# Patient Record
Sex: Male | Born: 1935 | Race: White | Hispanic: No | Marital: Married | Smoking: Former smoker
Health system: Southern US, Community
[De-identification: ages and names within clinical notes are randomized; demographics above are authoritative.]

## PROBLEM LIST (undated history)

## (undated) DIAGNOSIS — Z950 Presence of cardiac pacemaker: Secondary | ICD-10-CM

## (undated) DIAGNOSIS — I442 Atrioventricular block, complete: Secondary | ICD-10-CM

## (undated) DIAGNOSIS — IMO0002 Reserved for concepts with insufficient information to code with codable children: Secondary | ICD-10-CM

## (undated) DIAGNOSIS — I10 Essential (primary) hypertension: Secondary | ICD-10-CM

## (undated) DIAGNOSIS — F329 Major depressive disorder, single episode, unspecified: Secondary | ICD-10-CM

## (undated) DIAGNOSIS — Z9289 Personal history of other medical treatment: Secondary | ICD-10-CM

## (undated) DIAGNOSIS — Z8719 Personal history of other diseases of the digestive system: Secondary | ICD-10-CM

## (undated) DIAGNOSIS — I495 Sick sinus syndrome: Secondary | ICD-10-CM

## (undated) DIAGNOSIS — F32A Depression, unspecified: Secondary | ICD-10-CM

## (undated) DIAGNOSIS — F0390 Unspecified dementia without behavioral disturbance: Secondary | ICD-10-CM

## (undated) DIAGNOSIS — E785 Hyperlipidemia, unspecified: Secondary | ICD-10-CM

## (undated) DIAGNOSIS — I519 Heart disease, unspecified: Secondary | ICD-10-CM

## (undated) HISTORY — DX: Heart disease, unspecified: I51.9

## (undated) HISTORY — DX: Personal history of other diseases of the digestive system: Z87.19

## (undated) HISTORY — DX: Sick sinus syndrome: I49.5

## (undated) HISTORY — DX: Major depressive disorder, single episode, unspecified: F32.9

## (undated) HISTORY — DX: Depression, unspecified: F32.A

## (undated) HISTORY — DX: Personal history of other medical treatment: Z92.89

## (undated) HISTORY — DX: Atrioventricular block, complete: I44.2

## (undated) HISTORY — DX: Hyperlipidemia, unspecified: E78.5

## (undated) HISTORY — PX: ROTATOR CUFF REPAIR: SHX139

## (undated) HISTORY — PX: OTHER SURGICAL HISTORY: SHX169

---

## 1996-11-16 HISTORY — PX: PACEMAKER INSERTION: SHX728

## 1998-11-01 ENCOUNTER — Ambulatory Visit (HOSPITAL_COMMUNITY): Admission: RE | Admit: 1998-11-01 | Discharge: 1998-11-01 | Payer: Self-pay | Admitting: Cardiology

## 1998-11-01 ENCOUNTER — Encounter: Payer: Self-pay | Admitting: Cardiology

## 2003-11-17 HISTORY — PX: PACEMAKER GENERATOR CHANGE: SHX5998

## 2003-11-30 ENCOUNTER — Ambulatory Visit (HOSPITAL_COMMUNITY): Admission: RE | Admit: 2003-11-30 | Discharge: 2003-11-30 | Payer: Self-pay | Admitting: *Deleted

## 2010-04-30 DIAGNOSIS — Z9289 Personal history of other medical treatment: Secondary | ICD-10-CM

## 2010-04-30 HISTORY — PX: US ECHOCARDIOGRAPHY: HXRAD669

## 2010-04-30 HISTORY — DX: Personal history of other medical treatment: Z92.89

## 2011-07-07 ENCOUNTER — Emergency Department (HOSPITAL_COMMUNITY)
Admission: EM | Admit: 2011-07-07 | Discharge: 2011-07-08 | Disposition: A | Discharge status: Home / Self Care | Payer: Medicare Other | Attending: Emergency Medicine | Admitting: Emergency Medicine

## 2011-07-07 ENCOUNTER — Inpatient Hospital Stay (INDEPENDENT_AMBULATORY_CARE_PROVIDER_SITE_OTHER)
Admission: RE | Admit: 2011-07-07 | Discharge: 2011-07-07 | Disposition: A | Discharge status: Home / Self Care | Payer: Medicare Other | Source: Ambulatory Visit | Attending: Emergency Medicine | Admitting: Emergency Medicine

## 2011-07-07 DIAGNOSIS — N289 Disorder of kidney and ureter, unspecified: Secondary | ICD-10-CM | POA: Insufficient documentation

## 2011-07-07 DIAGNOSIS — Z79899 Other long term (current) drug therapy: Secondary | ICD-10-CM | POA: Insufficient documentation

## 2011-07-07 DIAGNOSIS — R5381 Other malaise: Secondary | ICD-10-CM | POA: Insufficient documentation

## 2011-07-07 DIAGNOSIS — R7309 Other abnormal glucose: Secondary | ICD-10-CM | POA: Insufficient documentation

## 2011-07-07 DIAGNOSIS — R42 Dizziness and giddiness: Secondary | ICD-10-CM | POA: Insufficient documentation

## 2011-07-07 DIAGNOSIS — Z7982 Long term (current) use of aspirin: Secondary | ICD-10-CM | POA: Insufficient documentation

## 2011-07-07 DIAGNOSIS — R61 Generalized hyperhidrosis: Secondary | ICD-10-CM | POA: Insufficient documentation

## 2011-07-07 DIAGNOSIS — R6883 Chills (without fever): Secondary | ICD-10-CM | POA: Insufficient documentation

## 2011-07-07 DIAGNOSIS — Z950 Presence of cardiac pacemaker: Secondary | ICD-10-CM | POA: Insufficient documentation

## 2011-07-07 DIAGNOSIS — R079 Chest pain, unspecified: Secondary | ICD-10-CM

## 2011-07-07 DIAGNOSIS — I2510 Atherosclerotic heart disease of native coronary artery without angina pectoris: Secondary | ICD-10-CM | POA: Insufficient documentation

## 2011-07-07 LAB — URINALYSIS, ROUTINE W REFLEX MICROSCOPIC
Bilirubin Urine: NEGATIVE
Glucose, UA: NEGATIVE mg/dL
Hgb urine dipstick: NEGATIVE
Ketones, ur: 15 mg/dL — AB
Leukocytes, UA: NEGATIVE
Nitrite: NEGATIVE
Protein, ur: NEGATIVE mg/dL
Specific Gravity, Urine: 1.02 (ref 1.005–1.030)
Urobilinogen, UA: 0.2 mg/dL (ref 0.0–1.0)
pH: 5 (ref 5.0–8.0)

## 2011-07-07 LAB — POCT I-STAT, CHEM 8
BUN: 50 mg/dL — ABNORMAL HIGH (ref 6–23)
Calcium, Ion: 1.38 mmol/L — ABNORMAL HIGH (ref 1.12–1.32)
Chloride: 107 mEq/L (ref 96–112)
Creatinine, Ser: 1.1 mg/dL (ref 0.50–1.35)
Glucose, Bld: 142 mg/dL — ABNORMAL HIGH (ref 70–99)
HCT: 36 % — ABNORMAL LOW (ref 39.0–52.0)
Hemoglobin: 12.2 g/dL — ABNORMAL LOW (ref 13.0–17.0)
Potassium: 5.1 mEq/L (ref 3.5–5.1)
Sodium: 140 mEq/L (ref 135–145)
TCO2: 27 mmol/L (ref 0–100)

## 2011-07-08 ENCOUNTER — Emergency Department (HOSPITAL_COMMUNITY): Payer: Medicare Other

## 2011-07-08 ENCOUNTER — Inpatient Hospital Stay (HOSPITAL_COMMUNITY)
Admission: EM | Admit: 2011-07-08 | Discharge: 2011-07-13 | DRG: 378 | Disposition: A | Discharge status: Home / Self Care | Payer: Medicare Other | Attending: Internal Medicine | Admitting: Internal Medicine

## 2011-07-08 DIAGNOSIS — K5730 Diverticulosis of large intestine without perforation or abscess without bleeding: Secondary | ICD-10-CM | POA: Diagnosis present

## 2011-07-08 DIAGNOSIS — D62 Acute posthemorrhagic anemia: Secondary | ICD-10-CM | POA: Diagnosis present

## 2011-07-08 DIAGNOSIS — I951 Orthostatic hypotension: Secondary | ICD-10-CM | POA: Diagnosis present

## 2011-07-08 DIAGNOSIS — K2981 Duodenitis with bleeding: Secondary | ICD-10-CM | POA: Diagnosis present

## 2011-07-08 DIAGNOSIS — K264 Chronic or unspecified duodenal ulcer with hemorrhage: Principal | ICD-10-CM | POA: Diagnosis present

## 2011-07-08 DIAGNOSIS — Z833 Family history of diabetes mellitus: Secondary | ICD-10-CM

## 2011-07-08 DIAGNOSIS — F0390 Unspecified dementia without behavioral disturbance: Secondary | ICD-10-CM | POA: Diagnosis present

## 2011-07-08 DIAGNOSIS — I10 Essential (primary) hypertension: Secondary | ICD-10-CM | POA: Diagnosis present

## 2011-07-08 DIAGNOSIS — E876 Hypokalemia: Secondary | ICD-10-CM | POA: Diagnosis present

## 2011-07-08 DIAGNOSIS — Z950 Presence of cardiac pacemaker: Secondary | ICD-10-CM

## 2011-07-08 DIAGNOSIS — E785 Hyperlipidemia, unspecified: Secondary | ICD-10-CM | POA: Diagnosis present

## 2011-07-08 LAB — DIFFERENTIAL
Lymphocytes Relative: 21 % (ref 12–46)
Lymphs Abs: 3 10*3/uL (ref 0.7–4.0)
Monocytes Relative: 10 % (ref 3–12)
Neutro Abs: 9.8 10*3/uL — ABNORMAL HIGH (ref 1.7–7.7)
Neutrophils Relative %: 68 % (ref 43–77)

## 2011-07-08 LAB — COMPREHENSIVE METABOLIC PANEL
AST: 11 U/L (ref 0–37)
Alkaline Phosphatase: 54 U/L (ref 39–117)
BUN: 57 mg/dL — ABNORMAL HIGH (ref 6–23)
CO2: 26 mEq/L (ref 19–32)
Chloride: 109 mEq/L (ref 96–112)
Creatinine, Ser: 0.84 mg/dL (ref 0.50–1.35)
GFR calc non Af Amer: >60 mL/min (ref >60)
Potassium: 4.5 mEq/L (ref 3.5–5.1)
Total Bilirubin: 0.1 mg/dL — ABNORMAL LOW (ref 0.3–1.2)

## 2011-07-08 LAB — CBC
HCT: 24.9 % — ABNORMAL LOW (ref 39.0–52.0)
Hemoglobin: 8.7 g/dL — ABNORMAL LOW (ref 13.0–17.0)
MCH: 33 pg (ref 26.0–34.0)
MCV: 94.3 fL (ref 78.0–100.0)
RBC: 2.64 MIL/uL — ABNORMAL LOW (ref 4.22–5.81)

## 2011-07-08 LAB — LIPASE, BLOOD: Lipase: 24 U/L (ref 11–59)

## 2011-07-09 DIAGNOSIS — K2980 Duodenitis without bleeding: Secondary | ICD-10-CM

## 2011-07-09 DIAGNOSIS — D62 Acute posthemorrhagic anemia: Secondary | ICD-10-CM

## 2011-07-09 DIAGNOSIS — K921 Melena: Secondary | ICD-10-CM

## 2011-07-09 DIAGNOSIS — K260 Acute duodenal ulcer with hemorrhage: Secondary | ICD-10-CM

## 2011-07-09 LAB — COMPREHENSIVE METABOLIC PANEL
AST: 11 U/L (ref 0–37)
BUN: 53 mg/dL — ABNORMAL HIGH (ref 6–23)
CO2: 21 mEq/L (ref 19–32)
Calcium: 8.8 mg/dL (ref 8.4–10.5)
Chloride: 111 mEq/L (ref 96–112)
Creatinine, Ser: 0.73 mg/dL (ref 0.50–1.35)
GFR calc Af Amer: >60 mL/min (ref >60)
GFR calc non Af Amer: >60 mL/min (ref >60)
Glucose, Bld: 103 mg/dL — ABNORMAL HIGH (ref 70–99)
Total Bilirubin: 0.5 mg/dL (ref 0.3–1.2)

## 2011-07-09 LAB — CBC
HCT: 25.2 % — ABNORMAL LOW (ref 39.0–52.0)
Hemoglobin: 8.5 g/dL — ABNORMAL LOW (ref 13.0–17.0)
Hemoglobin: 9 g/dL — ABNORMAL LOW (ref 13.0–17.0)
MCH: 32.2 pg (ref 26.0–34.0)
MCHC: 35.7 g/dL (ref 30.0–36.0)
MCV: 91.3 fL (ref 78.0–100.0)
Platelets: 194 10*3/uL (ref 150–400)
Platelets: 182 10*3/uL (ref 150–400)
RBC: 2.88 MIL/uL — ABNORMAL LOW (ref 4.22–5.81)
RBC: 2.64 MIL/uL — ABNORMAL LOW (ref 4.22–5.81)
RBC: 2.78 MIL/uL — ABNORMAL LOW (ref 4.22–5.81)
RDW: 13.2 % (ref 11.5–15.5)
WBC: 12.4 10*3/uL — ABNORMAL HIGH (ref 4.0–10.5)
WBC: 14.5 10*3/uL — ABNORMAL HIGH (ref 4.0–10.5)

## 2011-07-09 LAB — GLUCOSE, CAPILLARY
Glucose-Capillary: 132 mg/dL — ABNORMAL HIGH (ref 70–99)
Glucose-Capillary: 124 mg/dL — ABNORMAL HIGH (ref 70–99)
Glucose-Capillary: 103 mg/dL — ABNORMAL HIGH (ref 70–99)
Glucose-Capillary: 135 mg/dL — ABNORMAL HIGH (ref 70–99)
Glucose-Capillary: 133 mg/dL — ABNORMAL HIGH (ref 70–99)

## 2011-07-09 LAB — CK TOTAL AND CKMB (NOT AT ARMC): CK, MB: 2.6 ng/mL (ref 0.3–4.0)

## 2011-07-09 LAB — ABO/RH: ABO/RH(D): O POS

## 2011-07-09 LAB — PROTIME-INR: INR: 1.18 (ref 0.00–1.49)

## 2011-07-09 LAB — MRSA PCR SCREENING: MRSA by PCR: NEGATIVE

## 2011-07-09 LAB — CARDIAC PANEL(CRET KIN+CKTOT+MB+TROPI)
CK, MB: 2.4 ng/mL (ref 0.3–4.0)
Relative Index: INVALID (ref 0.0–2.5)
Relative Index: INVALID (ref 0.0–2.5)
Total CK: 76 U/L (ref 7–232)
Total CK: 75 U/L (ref 7–232)

## 2011-07-09 LAB — MAGNESIUM: Magnesium: 1.7 mg/dL (ref 1.5–2.5)

## 2011-07-09 LAB — CLOSTRIDIUM DIFFICILE BY PCR: Toxigenic C. Difficile by PCR: NEGATIVE

## 2011-07-09 MED ORDER — IOHEXOL 300 MG/ML  SOLN
100.0000 mL | Freq: Once | INTRAMUSCULAR | Status: AC | PRN
  Administered 2011-07-09: 100 mL via INTRAVENOUS

## 2011-07-10 LAB — BASIC METABOLIC PANEL
BUN: 28 mg/dL — ABNORMAL HIGH (ref 6–23)
Chloride: 115 mEq/L — ABNORMAL HIGH (ref 96–112)
Creatinine, Ser: 0.79 mg/dL (ref 0.50–1.35)
GFR calc non Af Amer: >60 mL/min (ref >60)
Glucose, Bld: 102 mg/dL — ABNORMAL HIGH (ref 70–99)
Potassium: 3.4 mEq/L — ABNORMAL LOW (ref 3.5–5.1)

## 2011-07-10 LAB — CBC
HCT: 23 % — ABNORMAL LOW (ref 39.0–52.0)
HCT: 23.1 % — ABNORMAL LOW (ref 39.0–52.0)
HCT: 25.1 % — ABNORMAL LOW (ref 39.0–52.0)
Hemoglobin: 8.3 g/dL — ABNORMAL LOW (ref 13.0–17.0)
Hemoglobin: 8.1 g/dL — ABNORMAL LOW (ref 13.0–17.0)
MCH: 32.8 pg (ref 26.0–34.0)
MCH: 32.2 pg (ref 26.0–34.0)
MCHC: 36.1 g/dL — ABNORMAL HIGH (ref 30.0–36.0)
MCHC: 35.1 g/dL (ref 30.0–36.0)
MCV: 90.9 fL (ref 78.0–100.0)
MCV: 92.4 fL (ref 78.0–100.0)
MCV: 93 fL (ref 78.0–100.0)
RBC: 2.7 MIL/uL — ABNORMAL LOW (ref 4.22–5.81)
RDW: 13.4 % (ref 11.5–15.5)
WBC: 9.5 10*3/uL (ref 4.0–10.5)

## 2011-07-10 LAB — CROSSMATCH
ABO/RH(D): O POS
Unit division: 0

## 2011-07-10 LAB — GLUCOSE, CAPILLARY

## 2011-07-10 LAB — DIFFERENTIAL
Basophils Absolute: 0.1 10*3/uL (ref 0.0–0.1)
Eosinophils Relative: 3 % (ref 0–5)
Lymphocytes Relative: 28 % (ref 12–46)
Lymphs Abs: 2.5 10*3/uL (ref 0.7–4.0)
Monocytes Absolute: 0.8 10*3/uL (ref 0.1–1.0)
Neutro Abs: 5.4 10*3/uL (ref 1.7–7.7)

## 2011-07-11 DIAGNOSIS — K921 Melena: Secondary | ICD-10-CM

## 2011-07-11 DIAGNOSIS — K2980 Duodenitis without bleeding: Secondary | ICD-10-CM

## 2011-07-11 DIAGNOSIS — K260 Acute duodenal ulcer with hemorrhage: Secondary | ICD-10-CM

## 2011-07-11 DIAGNOSIS — D62 Acute posthemorrhagic anemia: Secondary | ICD-10-CM

## 2011-07-11 LAB — CBC
HCT: 22.5 % — ABNORMAL LOW (ref 39.0–52.0)
Hemoglobin: 7.8 g/dL — ABNORMAL LOW (ref 13.0–17.0)
MCH: 32 pg (ref 26.0–34.0)
MCH: 32.4 pg (ref 26.0–34.0)
MCHC: 34.7 g/dL (ref 30.0–36.0)
MCHC: 34.6 g/dL (ref 30.0–36.0)
MCV: 93.4 fL (ref 78.0–100.0)
Platelets: 214 10*3/uL (ref 150–400)
RBC: 2.44 MIL/uL — ABNORMAL LOW (ref 4.22–5.81)
RDW: 13.3 % (ref 11.5–15.5)
RDW: 13.3 % (ref 11.5–15.5)

## 2011-07-11 LAB — BASIC METABOLIC PANEL
BUN: 15 mg/dL (ref 6–23)
Calcium: 8.5 mg/dL (ref 8.4–10.5)
Creatinine, Ser: 0.84 mg/dL (ref 0.50–1.35)
GFR calc non Af Amer: >60 mL/min (ref >60)
Glucose, Bld: 101 mg/dL — ABNORMAL HIGH (ref 70–99)

## 2011-07-12 DIAGNOSIS — K260 Acute duodenal ulcer with hemorrhage: Secondary | ICD-10-CM

## 2011-07-12 DIAGNOSIS — K921 Melena: Secondary | ICD-10-CM

## 2011-07-12 DIAGNOSIS — D62 Acute posthemorrhagic anemia: Secondary | ICD-10-CM

## 2011-07-12 DIAGNOSIS — K2980 Duodenitis without bleeding: Secondary | ICD-10-CM

## 2011-07-12 LAB — CBC
HCT: 21.5 % — ABNORMAL LOW (ref 39.0–52.0)
MCHC: 34.9 g/dL (ref 30.0–36.0)
Platelets: 212 10*3/uL (ref 150–400)
RDW: 13.4 % (ref 11.5–15.5)

## 2011-07-12 LAB — BASIC METABOLIC PANEL
BUN: 12 mg/dL (ref 6–23)
GFR calc Af Amer: >60 mL/min (ref >60)
GFR calc non Af Amer: >60 mL/min (ref >60)
Potassium: 3.6 mEq/L (ref 3.5–5.1)

## 2011-07-13 ENCOUNTER — Telehealth: Payer: Self-pay

## 2011-07-13 LAB — CROSSMATCH

## 2011-07-13 LAB — CBC
HCT: 26.3 % — ABNORMAL LOW (ref 39.0–52.0)
Hemoglobin: 9 g/dL — ABNORMAL LOW (ref 13.0–17.0)
MCH: 31.7 pg (ref 26.0–34.0)
MCHC: 34.2 g/dL (ref 30.0–36.0)

## 2011-07-13 MED ORDER — METRONIDAZOLE 500 MG PO TABS: 500.0000 mg | ORAL_TABLET | Freq: Two times a day (BID) | ORAL | Status: AC

## 2011-07-13 MED ORDER — CLARITHROMYCIN 500 MG PO TABS: 500.0000 mg | ORAL_TABLET | Freq: Two times a day (BID) | ORAL | Status: AC

## 2011-07-13 NOTE — Telephone Encounter (Signed)
Take what I prescribed previously without change. No need to take Cipro

## 2011-07-13 NOTE — Telephone Encounter (Signed)
Spoke with wife and she is aware, prescriptions sent to pharmacy for pt.

## 2011-07-13 NOTE — Telephone Encounter (Signed)
Called and spoke with the pt. Dr. Kevan Ny had prescribed Cipro 500mg  BID for 7 days and Metronidazole 500mg  BID for 7 days right before he was hospitalized. He only took one dose of each prior to going to the ER. Should pt just have 7 more days of the Metronidazole called in along with the Biaxin and hold the Cipro? Pt has the PPI and knows how to take that. Please advise.

## 2011-07-13 NOTE — Telephone Encounter (Signed)
Message copied by Michele Mcalpine on Mon Jul 13, 2011  2:31 PM ------      Message from: Hilarie Fredrickson      Created: Mon Jul 13, 2011  2:10 PM       Bonita Quin, I saw this patient at the hospital for a bleeding duodenal ulcer. His here go back to pylori antibody returned today and is positive. I think he was to be discharged home today. Call the patient and his wife and let them know that I recommend treatment for Helicobacter pylori. We should treat him with Biaxin 500 mg twice a day x2 weeks and metronidazole 500 mg twice a day x2 weeks. He is already on a twice a day PPI which he should continue. After completing his antibiotics, he should continue on twice a day PPI for another 6 weeks then once daily PPI indefinitely. I did review this with them previously

## 2011-07-21 NOTE — H&P (Signed)
NAME:  ULMER, DEGEN NO.:  [id number]  MEDICAL RECORD NO.:  [id number]  LOCATION:                                 FACILITY:  PHYSICIAN:  Eduard Clos, MDDATE OF BIRTH:  [birth year]  DATE OF ADMISSION: DATE OF DISCHARGE:                             HISTORY & PHYSICAL   PRIMARY CARE PHYSICIAN:  Hal T. Stoneking, MD  PRIMARY GASTROENTEROLOGY:  Judie Petit T. Russella Dar, MD, [facility]  CHIEF COMPLAINT:  Bleeding per rectum.  HISTORY OF PRESENT ILLNESS:  This 77 year old male with known history of hypertension, dementia, hyperlipidemia, pacemaker placement, has been experiencing some dizziness over the last 48 hours.  The patient had originally come night before this to the ER when the patient was found to be dizzy, was given some IV fluids, and sent home.  Yesterday morning, he started developing some rectal bleeding.  Initially, he had formed stools, he had gone to Dr. Laverle Hobby office but was seen by Dr. Kevan Ny as Dr. Pete Glatter was not there.  When Dr. Kevan Ny examined the patient, the patient had some left lower quadrant tenderness, was prescribed Cipro and Flagyl.  Despite taking these, the patient had further bowel movements with frank bleeding at least 3-4.  He came to the ER.  In the ER, the patient was found to have hemoglobin of 8.7, that is almost 4 grams drop from 24 hours and the patient also was orthostatic with blood pressure dropping to 80 systolic when he stands up.  At this time, the patient is admitted for acute GI bleed, most likely source could be lower GI.  Reviewing Dr. Kevan Ny' notes, it shows that the patient does have a diverticulosis and the patient's daughter states that the patient has had a colonoscopy within the last 5 years with Dr. Russella Dar and that showed some polyps.  The patient was dizzy.  Presently, on lying down, he is asymptomatic. Denies any chest pain, shortness of breath.  Did have some diaphoresis when he moved his bowels.   Denies any nausea, vomiting, and denies any abdominal pain at this time.  Denies any dysuria, discharges.  Has not had any bowel movements after coming to the ER.  Denies any cough or phlegm or fever or chills, headache or visual symptoms or focal deficit.  PAST MEDICAL HISTORY: 1. History of hypertension. 2. Hyperlipidemia. 3. Dementia. 4. Pacemaker placement.  MEDICATIONS PRIOR TO ADMISSION:  The patient is on aspirin 81 mg p.o. daily, was just started on Cipro and Flagyl yesterday by Dr. Kevan Ny.  The patient is on benazepril, gemfibrozil, folic acid.  SOCIAL HISTORY:  The patient quit smoking in 1995.  Denies any alcohol or drug abuse.  Lives with his wife.  FAMILY HISTORY:  Positive for diabetes in his parents and dementia in his sister.  ALLERGIES:  No known drug allergies.  REVIEW OF SYSTEMS:  As per the history of presenting illness, nothing else significant.  PHYSICAL EXAMINATION:  GENERAL:  The patient was examined at bedside, not in acute distress. VITAL SIGNS:  Blood pressure 120/60, pulse 80 per minute, temperature 98.7, respirations 18 per minute, O2 sat 100%. HEENT:  Anicteric.  Mild pallor.  The patient's face looks  little bit pale.  No facial asymmetry.  Tongue is midline. NECK:  No neck rigidity.  No discharge from ears, eyes, nose, or mouth. CHEST:  Bilateral air entry present.  No rhonchi, no crepitation. HEART:  S1 and S2 heard. ABDOMEN:  Soft, nontender.  Bowel sounds heard. CENTRAL NERVOUS SYSTEM:  The patient is alert, awake, and oriented to time, place, and person.  He is able to move upper and lower extremities. EXTREMITIES:  Peripheral pulses felt.  No acute ischemic changes, cyanosis, or clubbing.  Monitor showing paced rhythm at this time.  CBC:  WBC is 14.4; hemoglobin is 8.7 and 24 hours before that was 12.2; hematocrit is 24.9, a drop from 36 within 24 hours; platelets 229.  PT and INR are 15.3 and 1.1.  Complete metabolic panel:  Sodium  140, potassium 4.5, chloride 109, carbon dioxide 26, glucose 132, BUN 57, creatinine 0.8, total bilirubin is 0.1, alkaline phos 54, AST 11, ALT 6, total protein is 5.5, albumin 3.3, calcium 10.1, lipase is 24.  Fecal occult blood is negative.  ASSESSMENT: 1. Acute gastrointestinal bleed. 2. Anemia from acute blood loss. 3. History of hypertension, presently mildly hypotensive. 4. History of pacemaker placement. 5. History of dementia. 6. History of hyperlipidemia.  PLAN: 1. At this time, I will admit the patient to intensive care as the     patient is a bit orthostatic when he stands up and also had a 4-g     drop in his hemoglobin within 24 hours. 2. For his acute GI bleed and acute blood loss anemia, the patient is     receiving two units of packed red blood cells at this time.  The     patient will be kept n.p.o.  We will type and cross match for 4     units and hold, transfuse as necessary.  We will check CBC q.6 h.     for the next 24 hours.  I have consulted Dr. Leone Payor who will be     seeing the patient later.  As the patient does have mild     leukocytosis and also Dr. Kevan Ny' notes state that the patient has     some left lower quadrant pain, presently he has no pain, I am     keeping the patient on empirically Cipro and Flagyl.  We will also     get stool studies.  The patient is already going to get a CAT scan     of the abdomen and pelvis.  We will follow these results.  Based on the patient's clinical condition, test order, and consults recommendation, further orders will be recommended.     Eduard Clos, MD     ANK/MEDQ  D:  07/09/2011  T:  07/09/2011  Job:  [id number]  cc:   Venita Lick. Russella Dar, MD, FACG Hal T. Stoneking, M.D.  Electronically Signed by Midge Minium MD on 07/21/2011 09:33:58 AM

## 2011-08-13 NOTE — Discharge Summary (Signed)
NAME:  Kenneth Clay, Kenneth Clay NO.:  [id number]  MEDICAL RECORD NO.:  [id number]  LOCATION:  3305                         FACILITY:  [facility]  PHYSICIAN:  Lonia Blood, M.D.DATE OF BIRTH:  1932-10-27  DATE OF ADMISSION:  07/08/2011 DATE OF DISCHARGE:  07/13/2011                        DISCHARGE SUMMARY - REFERRING   PRIMARY CARE PHYSICIAN:  Hal T. Pete Glatter, MD  PRIMARY GASTROENTEROLOGIST:  Venita Lick. Russella Dar, MD, [facility].  EXPECTED DATE OF DISPOSITION:  July 13, 2011.  DISCHARGE DIAGNOSES: 1. Gastrointestinal bleed secondary to duodenal ulcer and duodenitis. 2. Acute blood loss anemia secondary to above. 3. History of hypertension, blood pressure medications held during     hospitalization. 4. History of hyperlipidemia. 5. Mild dementia. 6. Mild hypokalemia, resolved.  CONSULTATIONS DURING HOSPITALIZATION:   Gastroenterology.  PROCEDURES DURING HOSPITALIZATION: 1. CT of the abdomen and pelvis performed July 09, 2011, showing     diffuse diverticulosis without any evidence of diverticulitis, also     with mild BPH. 2. Upper endoscopy performed on July 09, 2011, by Dr. Yancey Flemings,     showing bleeding ulcer in the descending duodenum as well as     duodenitis.  BRIEF HISTORY OF PRESENT ILLNESS:  Kenneth Clay is a 77 year old male with past medical history of hypertension, mild dementia and hyperlipidemia, who presented to the emergency department on day of admission with complaints of bleeding per rectum.  Apparently, the patient had been to the ER on day prior with complaints of dizziness, however, at that time the patient's symptoms resolved with IV fluids and he was found to be hemodynamically stable during that visit and discharged home.  The patient then followed up with primary care physician's office as he developed rectal bleeding on morning after ED visit.  The patient was prescribed empiric Cipro and Flagyl as he did have some left  lower quadrant tenderness and thought maybe to be experiencing acute diverticular flare. Despite taking medications the patient continued to have frank rectal bleeding approximately 3-4 times prompting him to return to the ER.  Upon evaluation in the ER, the patient's hemoglobin found to have dropped to 8.7, almost 4 grams down from 24 hours prior. In addition, the patient was found to be orthostatic with systolic blood pressure dropping into the 80s upon standing.  At that time, the patient was admitted by [facility] for further evaluation and treatment.  COURSE OF HOSPITALIZATION: 1. GI bleed secondary to duodenal ulcer and duodenitis.  Given the     patient's orthostasis and active bleeding, the patient was     monitored throughout hospitalization in the step-down unit.  The     patient was initially transfused 2 units of packed red blood cells,     however, continued to have profound anemia and hypotension thereby     prompting two more units of packed red blood cells to be     transfused.  The patient did undergo upper endoscopy on July 09, 2011, showing bleeding ulcer in the bulb and descending portion of     the duodenum as well as duodenitis.  The patient was initially     placed on PPI drip and  has since been transitioned to p.o. PPI to     be taken b.i.d. for 8 weeks and then daily indefinitely.  The     patient's H. pylori obtained during endoscopy were both negative.     After 4 units of packed red blood cells, the patient is now     hemodynamically stable with no recurrent orthostasis and hemoglobin     of 9.0.  The patient has been instructed to avoid all NSAIDs,     though he denies any heavy use prior to this admission. 2. Acute blood loss anemia secondary to GI bleed.  As mentioned above,     the patient did receive 4 units of packed red blood cells during     this hospitalization.  At this point, he remains hemodynamically     stable with no recurrent  orthostasis.  The patient is felt     medically stable for discharge home with outpatient followup with     primary care physician next week to recheck hemoglobin. 3. History of hypertension.  Again, the patient was orthostatic at     time of admission, his blood pressure medications were held     throughout hospitalization.  The patient's blood pressure was     stable at this point, however, we will continue to hold the     patient's benazepril until follow with primary care physician next     week to determine further treatment. 4. Hypokalemia, mild.  Resolved with p.o. repletion.  DISCHARGE MEDICATIONS: 1. Protonix 40 mg p.o. b.i.d. until August 30, 2011, then 1 tablet     p.o. daily indefinitely. 2. Aricept 10 mg p.o. daily. 3. Calcium over the counter 1 tablet p.o. daily. 4. Folic acid 2 tablets p.o. daily. 5. Gemfibrozil 600 mg tab half tab p.o. b.i.d. 6. Multivitamin p.o. daily. 7. The patient instructed to discontinue Cipro and Flagyl. 8. The patient instructed to hold benazepril and aspirin until follow     with primary care physician next week.  PERTINENT LAB FINDINGS:  Discharge hemoglobin 9.0 up from as low as 7.5 during hospitalization.  White cell count 9.9, platelet count 243, sodium 146, potassium 3.6, BUN 12, creatinine 0.79, H. pylori test is negative.  C. diff PCR negative.  DISPOSITION:  The patient is felt medically stable for discharge at this time.  The patient has been ambulated by nursing staff and denies any dizziness, chest pain or shortness of breath.  The patient has not had any recurrent rectal bleeding in greater than 24 hours.  The patient is nonorthostatic and hemoglobin is stable at this point.  The patient is scheduled for followup with his primary care physician Dr. Orson Gear on Tuesday July 21, 2011, at 2:00 p.m. at which time he will need a recheck CBC and possible restart of blood pressure medications.     Cordelia Pen,  NP   ______________________________ Lonia Blood, M.D.    LE/MEDQ  D:  07/13/2011  T:  07/13/2011  Job:  [id number]  cc:   Hal T. Stoneking, M.D. Venita Lick. Russella Dar, MD, [facility]  Electronically Signed by Cordelia Pen NP on 07/17/2011 03:30:01 PM Electronically Signed by Jetty Duhamel M.D. on 08/13/2011 09:47:53 AM

## 2011-09-24 ENCOUNTER — Emergency Department (HOSPITAL_COMMUNITY)
Admission: EM | Admit: 2011-09-24 | Discharge: 2011-09-24 | Disposition: A | Discharge status: Home / Self Care | Payer: Medicare Other | Attending: Emergency Medicine | Admitting: Emergency Medicine

## 2011-09-24 ENCOUNTER — Encounter: Payer: Self-pay | Admitting: Emergency Medicine

## 2011-09-24 ENCOUNTER — Emergency Department (HOSPITAL_COMMUNITY): Payer: Medicare Other

## 2011-09-24 ENCOUNTER — Other Ambulatory Visit: Payer: Self-pay

## 2011-09-24 DIAGNOSIS — W1789XA Other fall from one level to another, initial encounter: Secondary | ICD-10-CM | POA: Insufficient documentation

## 2011-09-24 DIAGNOSIS — R0789 Other chest pain: Secondary | ICD-10-CM | POA: Insufficient documentation

## 2011-09-24 DIAGNOSIS — W19XXXA Unspecified fall, initial encounter: Secondary | ICD-10-CM

## 2011-09-24 DIAGNOSIS — Z950 Presence of cardiac pacemaker: Secondary | ICD-10-CM | POA: Insufficient documentation

## 2011-09-24 DIAGNOSIS — F0390 Unspecified dementia without behavioral disturbance: Secondary | ICD-10-CM | POA: Insufficient documentation

## 2011-09-24 DIAGNOSIS — I10 Essential (primary) hypertension: Secondary | ICD-10-CM | POA: Insufficient documentation

## 2011-09-24 DIAGNOSIS — S20219A Contusion of unspecified front wall of thorax, initial encounter: Secondary | ICD-10-CM | POA: Insufficient documentation

## 2011-09-24 DIAGNOSIS — IMO0002 Reserved for concepts with insufficient information to code with codable children: Secondary | ICD-10-CM | POA: Insufficient documentation

## 2011-09-24 HISTORY — DX: Reserved for concepts with insufficient information to code with codable children: IMO0002

## 2011-09-24 HISTORY — DX: Unspecified dementia, unspecified severity, without behavioral disturbance, psychotic disturbance, mood disturbance, and anxiety: F03.90

## 2011-09-24 HISTORY — DX: Presence of cardiac pacemaker: Z95.0

## 2011-09-24 HISTORY — DX: Essential (primary) hypertension: I10

## 2011-09-24 MED ORDER — MORPHINE SULFATE 4 MG/ML IJ SOLN: 4.0000 mg | Freq: Once | INTRAMUSCULAR | Status: DC

## 2011-09-24 MED ORDER — HYDROCODONE-ACETAMINOPHEN 5-325 MG PO TABS
1.0000 | ORAL_TABLET | Freq: Once | ORAL | Status: AC
  Administered 2011-09-24: 1 via ORAL
  Filled 2011-09-24: qty 1

## 2011-09-24 MED ORDER — HYDROCODONE-ACETAMINOPHEN 5-325 MG PO TABS: 1.0000 | ORAL_TABLET | ORAL | Status: AC | PRN

## 2011-09-24 MED ORDER — ONDANSETRON HCL 4 MG/2ML IJ SOLN
4.0000 mg | Freq: Once | INTRAMUSCULAR | Status: AC
  Administered 2011-09-24: 4 mg via INTRAVENOUS
  Filled 2011-09-24: qty 2

## 2011-09-24 MED ORDER — MORPHINE SULFATE 4 MG/ML IJ SOLN
4.0000 mg | Freq: Once | INTRAMUSCULAR | Status: AC
  Administered 2011-09-24: 4 mg via INTRAVENOUS
  Filled 2011-09-24: qty 1

## 2011-09-24 NOTE — ED Provider Notes (Signed)
I saw and evaluated the patient, reviewed the resident's note and I agree with the findings and plan.  Nicholes Stairs, MD 09/24/11 [phone]

## 2011-09-24 NOTE — ED Provider Notes (Signed)
History     CSN: [id number] Arrival date & time: 09/24/2011  9:08 AM   First MD Initiated Contact with Patient 09/24/11 [phone]      Chief Complaint  Patient presents with  . Fall    right rib pain    (Consider location/radiation/quality/duration/timing/severity/associated sxs/prior treatment) Patient is a 77 y.o. male presenting with fall. The history is provided by the patient and the EMS personnel.  Fall The accident occurred 2 days ago. The fall occurred from a stool. He fell from a height of 3 to 5 ft. He landed on concrete. Point of impact: R chest. Pain location: R chest. The pain is severe. He was ambulatory at the scene. There was no entrapment after the fall. There was no drug use involved in the accident. Pertinent negatives include no visual change, no fever, no numbness, no abdominal pain, no vomiting, no hematuria and no loss of consciousness. The symptoms are aggravated by activity. He has tried nothing for the symptoms.    Past Medical History  Diagnosis Date  . Pacemaker   . Dementia   . Ulcer   . Hypertension     History reviewed. No pertinent past surgical history.  History reviewed. No pertinent family history.  History  Substance Use Topics  . Smoking status: Never Smoker   . Smokeless tobacco: Not on file  . Alcohol Use: No      Review of Systems  Constitutional: Negative for fever.  Respiratory: Positive for shortness of breath. Negative for chest tightness and stridor.   Cardiovascular: Negative for chest pain.  Gastrointestinal: Negative for vomiting and abdominal pain.  Genitourinary: Negative for hematuria and difficulty urinating.  Neurological: Negative for loss of consciousness and numbness.  All other systems reviewed and are negative.    Allergies  Review of patient's allergies indicates no known allergies.  Home Medications   Current Outpatient Rx  Name Route Sig Dispense Refill  . BENAZEPRIL HCL 20 MG PO TABS Oral Take 20 mg by  mouth daily.      . DONEPEZIL HCL 10 MG PO TABS Oral Take 10 mg by mouth daily.      Marland Kitchen GEMFIBROZIL 600 MG PO TABS Oral Take 300 mg by mouth 2 (two) times daily before a meal. Half-tablet     . PANTOPRAZOLE SODIUM 40 MG PO TBEC Oral Take 40 mg by mouth 2 (two) times daily.      . TRAMADOL HCL 50 MG PO TABS Oral Take 50-100 mg by mouth every 6 (six) hours as needed. For pain  Maximum dose= 8 tablets per day     . HYDROCODONE-ACETAMINOPHEN 5-325 MG PO TABS Oral Take 1 tablet by mouth every 4 (four) hours as needed for pain. 10 tablet 0    BP 113/49  Pulse 74  Temp(Src) 98 F (36.7 C) (Oral)  Resp 18  SpO2 96%  Physical Exam  Nursing note and vitals reviewed. Constitutional: He is oriented to person, place, and time. He appears well-developed and well-nourished.  HENT:  Head: Normocephalic and atraumatic.  Eyes: EOM are normal. Pupils are equal, round, and reactive to light.  Neck: Normal range of motion.  Cardiovascular: Normal rate, regular rhythm and normal heart sounds.   Pulmonary/Chest: Effort normal and breath sounds normal. He exhibits tenderness (TTP of R lower chest).  Abdominal: Soft. There is no tenderness. There is no rebound and no guarding.  Musculoskeletal: Normal range of motion. He exhibits no edema.  Neurological: He is alert and oriented to  person, place, and time. No cranial nerve deficit.  Skin: Skin is warm and dry.    ED Course  [place] bedside Date/Time: 09/24/2011 9:20 AM Performed by: Nena Alexander Authorized by: Nicholes Stairs Consent: Verbal consent obtained. Consent given by: patient Comments: FAST performed on patient due to RUQ pain.  Neg for free fluid in abd   (including critical care time)  Labs Reviewed - No data to display Dg Chest Portable 1 View  09/24/2011  *RADIOLOGY REPORT*  Clinical Data: Fall and right-sided chest pain.  PORTABLE CHEST - 1 VIEW  Comparison: None.  Findings: Single view of the chest demonstrates a left cardiac  pacemaker.  No evidence for a pneumothorax.  There is no focal lung disease.  Heart size is within normal limits and the trachea is midline.  Surgical changes in the right shoulder.  Question old left rib fractures.  IMPRESSION: No acute chest findings.  Original Report Authenticated By: Richarda Overlie, M.D.     1. Fall   2. Rib contusion       Date: 09/24/2011  Rate: 69  Rhythm: Atrial paced  QRS Axis: normal  Intervals: normal  ST/T Wave abnormalities: normal  Conduction Disutrbances:right bundle branch block  Narrative Interpretation:   Old EKG Reviewed: unchanged   MDM  Pt presented after fall from stool that had wheels 2 days ago.  Was seen at Urgent care prescribed tramadol and sent home.  CXR per family was neg at that time.  Comes in for continued pain to R lower chest.  Bedside US neg for free fluid in abd.  CXR ordered.  Given IV pain meds.  Pt able to take a deep breath now. Will give hydrocodone PO prior to d/c.  Told him to see doctor tomorrow for recheck.          Nena Alexander, MD Resident 09/24/11 [phone]

## 2011-09-24 NOTE — ED Notes (Signed)
Per EMS: pt feel at home in shed yesterday c/o right rib pain; pt sts painful to take deep breath; pt given fentanyl in route; 18g L hand

## 2011-09-24 NOTE — ED Provider Notes (Addendum)
I saw and evaluated the patient, reviewed the resident's note and I agree with the findings and plan. 29 y male fell off a stool, which was about 2 feet high and had wheels.  His injury was 2 days ago.  He fell onto cement.  He denies a head injury.  He denies a headache.  He complains of right sided rib pain, which increases with movement and deep inspiration.  He denies shortness of breath.  He is not hypotensive or tachycardic in the does not have any indications of an abdominal injury.  Fast exam did not show any intraperitoneal blood.  Chest x-ray does not show any fractures.  Will release on narcotic analgesics.  Nicholes Stairs, MD 09/24/11 [phone] I saw and evaluated the patient, reviewed the resident's note and I agree with the findings and plan.  Nicholes Stairs, MD 09/24/11 [phone]

## 2011-09-24 NOTE — ED Notes (Signed)
IV d/c on discharge.

## 2011-10-01 ENCOUNTER — Encounter: Payer: Self-pay | Admitting: Gastroenterology

## 2012-12-27 ENCOUNTER — Other Ambulatory Visit: Payer: Self-pay | Admitting: *Deleted

## 2012-12-27 DIAGNOSIS — G309 Alzheimer's disease, unspecified: Secondary | ICD-10-CM | POA: Insufficient documentation

## 2012-12-27 DIAGNOSIS — Z9889 Other specified postprocedural states: Secondary | ICD-10-CM | POA: Insufficient documentation

## 2012-12-27 DIAGNOSIS — H547 Unspecified visual loss: Secondary | ICD-10-CM | POA: Insufficient documentation

## 2012-12-27 DIAGNOSIS — R259 Unspecified abnormal involuntary movements: Secondary | ICD-10-CM | POA: Insufficient documentation

## 2012-12-28 ENCOUNTER — Ambulatory Visit
Admission: RE | Admit: 2012-12-28 | Discharge: 2012-12-28 | Disposition: A | Discharge status: Home / Self Care | Payer: Medicare Other | Source: Ambulatory Visit | Attending: Cardiovascular Disease | Admitting: Cardiovascular Disease

## 2012-12-28 ENCOUNTER — Other Ambulatory Visit: Payer: Self-pay | Admitting: Cardiovascular Disease

## 2012-12-28 DIAGNOSIS — Z01811 Encounter for preprocedural respiratory examination: Secondary | ICD-10-CM

## 2012-12-29 ENCOUNTER — Encounter (HOSPITAL_COMMUNITY): Payer: Self-pay | Admitting: Pharmacy Technician

## 2013-01-02 ENCOUNTER — Other Ambulatory Visit: Payer: Self-pay | Admitting: *Deleted

## 2013-01-02 MED ORDER — CHLORHEXIDINE GLUCONATE 4 % EX LIQD
60.0000 mL | Freq: Once | CUTANEOUS | Status: DC
  Filled 2013-01-02: qty 60

## 2013-01-02 MED ORDER — SODIUM CHLORIDE 0.45 % IV SOLN
INTRAVENOUS | Status: DC
  Administered 2013-01-03: 1000 mL via INTRAVENOUS

## 2013-01-02 MED ORDER — SODIUM CHLORIDE 0.9 % IR SOLN
80.0000 mg | Status: DC
  Filled 2013-01-02: qty 2

## 2013-01-02 MED ORDER — SODIUM CHLORIDE 0.9 % IJ SOLN: 3.0000 mL | INTRAMUSCULAR | Status: DC | PRN

## 2013-01-02 MED ORDER — CEFAZOLIN SODIUM-DEXTROSE 2-3 GM-% IV SOLR
2.0000 g | INTRAVENOUS | Status: DC
  Filled 2013-01-02 (×2): qty 50

## 2013-01-03 ENCOUNTER — Encounter (HOSPITAL_COMMUNITY): Payer: Self-pay | Admitting: Cardiology

## 2013-01-03 ENCOUNTER — Encounter (HOSPITAL_COMMUNITY)
Admission: RE | Disposition: A | Discharge status: Home / Self Care | Payer: Self-pay | Source: Ambulatory Visit | Attending: Cardiovascular Disease

## 2013-01-03 ENCOUNTER — Ambulatory Visit (HOSPITAL_COMMUNITY)
Admission: RE | Admit: 2013-01-03 | Discharge: 2013-01-03 | Disposition: A | Discharge status: Home / Self Care | Payer: Medicare Other | Source: Ambulatory Visit | Attending: Cardiovascular Disease | Admitting: Cardiovascular Disease

## 2013-01-03 DIAGNOSIS — F0390 Unspecified dementia without behavioral disturbance: Secondary | ICD-10-CM | POA: Insufficient documentation

## 2013-01-03 DIAGNOSIS — Z45018 Encounter for adjustment and management of other part of cardiac pacemaker: Secondary | ICD-10-CM | POA: Insufficient documentation

## 2013-01-03 DIAGNOSIS — I10 Essential (primary) hypertension: Secondary | ICD-10-CM | POA: Diagnosis present

## 2013-01-03 DIAGNOSIS — I495 Sick sinus syndrome: Secondary | ICD-10-CM | POA: Insufficient documentation

## 2013-01-03 DIAGNOSIS — IMO0002 Reserved for concepts with insufficient information to code with codable children: Secondary | ICD-10-CM | POA: Diagnosis present

## 2013-01-03 DIAGNOSIS — E785 Hyperlipidemia, unspecified: Secondary | ICD-10-CM | POA: Insufficient documentation

## 2013-01-03 DIAGNOSIS — Z950 Presence of cardiac pacemaker: Secondary | ICD-10-CM | POA: Diagnosis present

## 2013-01-03 HISTORY — PX: PACEMAKER GENERATOR CHANGE: SHX5998

## 2013-01-03 HISTORY — PX: PACEMAKER GENERATOR CHANGE: SHX5481

## 2013-01-03 LAB — SURGICAL PCR SCREEN
MRSA, PCR: NEGATIVE
Staphylococcus aureus: NEGATIVE

## 2013-01-03 SURGERY — PACEMAKER GENERATOR CHANGE
Anesthesia: LOCAL

## 2013-01-03 MED ORDER — GEMFIBROZIL 600 MG PO TABS: 300.0000 mg | ORAL_TABLET | Freq: Two times a day (BID) | ORAL | Status: DC

## 2013-01-03 MED ORDER — HEPARIN (PORCINE) IN NACL 2-0.9 UNIT/ML-% IJ SOLN
INTRAMUSCULAR | Status: AC
  Filled 2013-01-03: qty 500

## 2013-01-03 MED ORDER — ONDANSETRON HCL 4 MG/2ML IJ SOLN: 4.0000 mg | Freq: Four times a day (QID) | INTRAMUSCULAR | Status: DC | PRN

## 2013-01-03 MED ORDER — SODIUM CHLORIDE 0.9 % IV SOLN: INTRAVENOUS | Status: AC

## 2013-01-03 MED ORDER — LIDOCAINE HCL (PF) 1 % IJ SOLN
INTRAMUSCULAR | Status: AC
  Filled 2013-01-03: qty 30

## 2013-01-03 MED ORDER — ACETAMINOPHEN 325 MG PO TABS: 325.0000 mg | ORAL_TABLET | ORAL | Status: DC | PRN

## 2013-01-03 MED ORDER — HYDROCODONE-ACETAMINOPHEN 5-325 MG PO TABS: 1.0000 | ORAL_TABLET | ORAL | Status: DC | PRN

## 2013-01-03 MED ORDER — FOLIC ACID 400 MCG PO TABS: 400.0000 ug | ORAL_TABLET | Freq: Every day | ORAL | Status: DC

## 2013-01-03 MED ORDER — MUPIROCIN 2 % EX OINT
TOPICAL_OINTMENT | Freq: Two times a day (BID) | CUTANEOUS | Status: DC
  Administered 2013-01-03: 1 via NASAL
  Filled 2013-01-03: qty 22

## 2013-01-03 MED ORDER — MUPIROCIN 2 % EX OINT
TOPICAL_OINTMENT | CUTANEOUS | Status: AC
  Administered 2013-01-03: 1 via NASAL
  Filled 2013-01-03: qty 22

## 2013-01-03 MED ORDER — BENAZEPRIL HCL 20 MG PO TABS: 20.0000 mg | ORAL_TABLET | Freq: Every day | ORAL | Status: DC

## 2013-01-03 MED ORDER — FENTANYL CITRATE 0.05 MG/ML IJ SOLN
INTRAMUSCULAR | Status: AC
  Filled 2013-01-03: qty 2

## 2013-01-03 MED ORDER — MIDAZOLAM HCL 2 MG/2ML IJ SOLN
INTRAMUSCULAR | Status: AC
  Filled 2013-01-03: qty 2

## 2013-01-03 MED ORDER — OMEGA-3-ACID ETHYL ESTERS 1 G PO CAPS: 1.0000 g | ORAL_CAPSULE | Freq: Every day | ORAL | Status: DC

## 2013-01-03 MED ORDER — ASPIRIN EC 81 MG PO TBEC: 81.0000 mg | DELAYED_RELEASE_TABLET | Freq: Every day | ORAL | Status: DC

## 2013-01-03 MED ORDER — DONEPEZIL HCL 10 MG PO TABS: 10.0000 mg | ORAL_TABLET | Freq: Every day | ORAL | Status: DC

## 2013-01-03 MED ORDER — PANTOPRAZOLE SODIUM 40 MG PO TBEC: 40.0000 mg | DELAYED_RELEASE_TABLET | Freq: Every day | ORAL | Status: DC

## 2013-01-03 MED ORDER — CITALOPRAM HYDROBROMIDE 10 MG PO TABS: 5.0000 mg | ORAL_TABLET | Freq: Every day | ORAL | Status: DC

## 2013-01-03 MED ORDER — ADULT MULTIVITAMIN W/MINERALS CH: 1.0000 | ORAL_TABLET | Freq: Every day | ORAL | Status: DC

## 2013-01-03 MED ORDER — TRAMADOL HCL 50 MG PO TABS: 50.0000 mg | ORAL_TABLET | Freq: Four times a day (QID) | ORAL | Status: DC | PRN

## 2013-01-03 NOTE — CV Procedure (Signed)
Kenneth Clay, Kenneth Clay Male, 79 y.o., April 18, 1932  Location: C 3 Bed: NONE  MRN: [id number]  CSN: [id number] Admit Dt: 01/03/13   Procedure report  Procedure performed:  1. Dual chamber pacemaker generator changeout  2. Light sedation  Reason for procedure:  1. Device generator at elective replacement interval  2. Sinus node arrest Procedure performed by:  Thurmon Fair, MD  Complications:  None  Estimated blood loss:  <5 mL  Medications administered during procedure:  Ancef 2 g intravenously, lidocaine 1% 30 mL locally, fentanyl 25 mcg intravenously, Versed 1 mg intravenously Device details:   [employer]. Jude [employer] DR RF model number [id number], serial number [id number] Right atrial lead (chronic) St Jude [id number], serial number [id number] (implanted January 18, 1997) Right ventricular lead (chronic)  St Jude [id number], serial number [id number] (implanted January 18, 1997)  Explanted generator [place],  model number [id number], serial number  [id number] (implanted 11/30/2003)  Procedure details:  After the risks and benefits of the procedure were discussed the patient provided informed consent. She was brought to the cardiac catheter lab in the fasting state. The patient was prepped and draped in usual sterile fashion. Local anesthesia with 1% lidocaine was administered to to the left infraclavicular area. A 5-6cm horizontal incision was made parallel with and 2-3 cm caudal to the left clavicle, in the area of an old scar. Using minimal electrocautery and mostly sharp and blunt dissection the prepectoral pocket was opened carefully to avoid injury to the loops of chronic leads. Extensive dissection was necessary. The device was explanted. The pocket was carefully inspected for hemostasis and flushed with copious amounts of antibiotic solution.  The leads were disconnected from the old generator and testing of the lead parameters later showed excellent values. The new generator was connected to the chronic leads, with  appropriate pacing noted.   The entire system was then carefully inserted in the pocket with care been taking that the leads and device assumed a comfortable position without pressure on the incision. Great care was taken that the leads be located deep to the generator. The pocket was then closed in layers using 2 layers of 2-0 Vicryl and cutaneous staples after which a sterile dressing was applied.   At the end of the procedure the following lead parameters were encountered:   Right atrial lead sensed P waves 5.1 mV (retrograde during RV pacing), impedance 490 ohms, threshold 0.7 at 0.5 ms pulse width.  Right ventricular lead sensed R waves  6.6 mV, impedance 580 ohms, threshold 1.3 at 0.5 ms pulse width.  Thurmon Fair, MD, [facility] [phone] office [phone] pager 01/03/2013 10:22 AM

## 2013-01-03 NOTE — H&P (Signed)
Patient ID: CUINN WESTERHOLD MRN: [id number], DOB/AGE: 77-Feb-1933   Admit date: 01/03/2013   Primary Physician: Ginette Otto, MD Primary Cardiologist: Dr Royann Shivers  HPI: 79 y/o followed by our group with a history of SSS. He is s/p Pacemaker insertion in 1998. He had a generator change in 2005. He is her now for an elective generator change out as he is at [facility] ([place]). He has had no change in his medical condition since his LOV. He had a low risk Myoview June 2011. Echo in June 2011 showed preserved LVF with diastolic dysfunction.   Problem List: Past Medical History  Diagnosis Date  . Pacemaker   . Dementia   . Ulcer   . Hypertension     Past Surgical History  Procedure Laterality Date  . Pacemaker insertion  1998  . Biv pacemaker generator change out  2005     Allergies: No Known Allergies   Home Medications Prescriptions prior to admission  Medication Sig Dispense Refill  . aspirin EC 81 MG tablet Take 81 mg by mouth daily.      . benazepril (LOTENSIN) 20 MG tablet Take 20 mg by mouth daily.        . citalopram (CELEXA) 10 MG tablet Take 5 mg by mouth daily.      Marland Kitchen donepezil (ARICEPT) 10 MG tablet Take 10 mg by mouth at bedtime.       . folic acid (FOLVITE) 400 MCG tablet Take 400 mcg by mouth daily.      Marland Kitchen gemfibrozil (LOPID) 600 MG tablet Take 300 mg by mouth 2 (two) times daily before a meal.       . Multiple Vitamin (MULTIVITAMIN WITH MINERALS) TABS Take 1 tablet by mouth daily.      Marland Kitchen omega-3 acid ethyl esters (LOVAZA) 1 G capsule Take 1 g by mouth daily.      . pantoprazole (PROTONIX) 40 MG tablet Take 40 mg by mouth daily.       . traMADol (ULTRAM) 50 MG tablet Take 50-100 mg by mouth every 6 (six) hours as needed for pain.          No family history on file.   History   Social History  . Marital Status: Married    Spouse Name: N/A    Number of Children: N/A  . Years of Education: N/A   Occupational History  . Not on file.   Social History Main Topics   . Smoking status: Never Smoker   . Smokeless tobacco: Not on file  . Alcohol Use: No  . Drug Use: No  . Sexually Active:    Other Topics Concern  . Not on file   Social History Narrative  . No narrative on file     Review of Systems: General: negative for chills, fever, night sweats or weight changes.  Cardiovascular: negative for chest pain, dyspnea on exertion, edema, orthopnea, palpitations, paroxysmal nocturnal dyspnea or shortness of breath Dermatological: negative for rash Respiratory: negative for cough or wheezing Urologic: negative for hematuria Abdominal: negative for nausea, vomiting, diarrhea, bright red blood per rectum, melena, or hematemesis Neurologic: negative for visual changes, syncope, or dizziness All other systems reviewed and are otherwise negative except as noted above.  Physical Exam: Blood pressure 155/73, pulse 62, temperature 96.9 F (36.1 C), temperature source Oral, resp. rate 18, height 5\' 8"  (1.727 m), weight 81.647 kg (180 lb), SpO2 95.00%.  General appearance: alert, cooperative and no distress Neck: no adenopathy, no carotid bruit, no  JVD, supple, symmetrical, trachea midline and thyroid not enlarged, symmetric, no tenderness/mass/nodules Lungs: clear to auscultation bilaterally Heart: regular rate and rhythm and quiet hearrt sounds Abdomen: soft, non-tender; bowel sounds normal; no masses,  no organomegaly Extremities: extremities normal, atraumatic, no cyanosis or edema Pulses: 2+ and symmetric Skin: Skin color, texture, turgor normal. No rashes or lesions Neurologic: Grossly normal    Labs:  No results found for this or any previous visit (from the past 24 hour(s)).   Radiology/Studies: Dg Chest 2 View  12/28/2012  *RADIOLOGY REPORT*  Clinical Data: Preop, for up date of pacemaker  CHEST - 2 VIEW  Comparison: Portable chest x-ray of 09/24/2011  Findings: No active infiltrate or effusion is seen.  The lungs are somewhat hyperaerated.   Cardiomegaly is stable and a dual lead permanent pacemaker remains.  There are degenerative changes throughout the thoracic spine.  IMPRESSION: No active lung disease.  Slight hyperaeration.  Little change in cardiomegaly with permanent pacemaker.   Original Report Authenticated By: Dwyane Dee, M.D.     [id number]:[id number]  ASSESSMENT AND PLAN:  Principal Problem:   Pacemaker for sinus node dysfunction-'98, '05- Gen change 01/03/13 (St Jude) Active Problems:   HTN (hypertension)   Dementia   Prior PUD   Dyslipidemia  Plan- Generator change today. Leads are original and apparently OK.  Deland Pretty, PA-C 01/03/2013, 8:22 AM

## 2013-01-13 ENCOUNTER — Encounter: Payer: Self-pay | Admitting: Cardiology

## 2013-02-20 ENCOUNTER — Encounter: Payer: Self-pay | Admitting: *Deleted

## 2013-02-22 ENCOUNTER — Encounter: Payer: Self-pay | Admitting: Cardiovascular Disease

## 2013-05-29 ENCOUNTER — Encounter: Payer: Self-pay | Admitting: Neurology

## 2013-05-29 ENCOUNTER — Ambulatory Visit (INDEPENDENT_AMBULATORY_CARE_PROVIDER_SITE_OTHER): Payer: Medicare Other | Admitting: Neurology

## 2013-05-29 VITALS — BP 137/72 | HR 75 | Ht 67.0 in | Wt 181.0 lb

## 2013-05-29 DIAGNOSIS — G309 Alzheimer's disease, unspecified: Secondary | ICD-10-CM

## 2013-05-29 DIAGNOSIS — F0280 Dementia in other diseases classified elsewhere without behavioral disturbance: Secondary | ICD-10-CM

## 2013-05-29 DIAGNOSIS — Z9889 Other specified postprocedural states: Secondary | ICD-10-CM

## 2013-05-29 DIAGNOSIS — H547 Unspecified visual loss: Secondary | ICD-10-CM

## 2013-05-29 DIAGNOSIS — R259 Unspecified abnormal involuntary movements: Secondary | ICD-10-CM

## 2013-05-29 MED ORDER — MEMANTINE HCL ER 28 MG PO CP24: 28.0000 mg | ORAL_CAPSULE | Freq: Every day | ORAL | Status: DC

## 2013-05-29 MED ORDER — MEMANTINE HCL 28 X 5 MG & 21 X 10 MG PO TABS: ORAL_TABLET | ORAL | Status: DC

## 2013-05-29 NOTE — Progress Notes (Signed)
Reason for visit: Memory disorder  Kenneth Clay is an 79 y.o. male  History of present illness:  Mr. Kenneth Clay is a 79 year old right-handed white male with a history of a progressive memory disorder. The patient has been followed by Dr. Sandria Manly for about 8 years. The patient is on Aricept, and he is tolerating the medication well. The patient indicates that he has had no significant progression in his memory since last seen in February 2014. No other new medical problems have come up since last seen. The patient is operating a motor vehicle, but he does have some issues with directions at times. The patient is less confident in himself in terms of activities of daily living such as doing home repairs. The patient has never done the finances. The patient has not given up any activities secondary to memory problems. The patient returns to this office for an evaluation. The patient recently had a revision of his pacemaker.  Past Medical History  Diagnosis Date  . Pacemaker     new gen. last check with insert 01/03/13  . Dementia   . Ulcer   . Hypertension   . Sinus node dysfunction   . Hyperlipidemia   . LV dysfunction     last echo-04/30/10, EF 50-55%, impaired LV relaxation  . H/O cardiovascular stress test 04/30/2010    Persantine myoview-normal  . H/O: GI bleed   . Depression     Past Surgical History  Procedure Laterality Date  . Pacemaker insertion  1998  . Pacemaker generator change  2005    St. Jude [place] [facility] DR  . Pacemaker generator change  01/03/2013    ST. Jude Accent DR RF  . Rotator cuff repair      bilateral  . US echocardiography  04/30/10    aortic root sclerotic,RV mildly dilated    Family History  Problem Relation Age of Onset  . Diabetes Mother   . Heart disease Father   . Diabetes Brother     Social history:  reports that he quit smoking about 8 years ago. He has never used smokeless tobacco. He reports that he does not drink alcohol or use illicit  drugs.  Allergies: No Known Allergies  Medications:  Current Outpatient Prescriptions on File Prior to Visit  Medication Sig Dispense Refill  . aspirin EC 81 MG tablet Take 81 mg by mouth daily.      . benazepril (LOTENSIN) 20 MG tablet Take 20 mg by mouth daily.        . citalopram (CELEXA) 10 MG tablet Take 5 mg by mouth daily.      Marland Kitchen donepezil (ARICEPT) 10 MG tablet Take 10 mg by mouth at bedtime.       . folic acid (FOLVITE) 400 MCG tablet Take 400 mcg by mouth daily.      Marland Kitchen gemfibrozil (LOPID) 600 MG tablet Take 300 mg by mouth 2 (two) times daily before a meal.       . Multiple Vitamin (MULTIVITAMIN WITH MINERALS) TABS Take 1 tablet by mouth daily.      Marland Kitchen omega-3 acid ethyl esters (LOVAZA) 1 G capsule Take 1 g by mouth daily.      . pantoprazole (PROTONIX) 40 MG tablet Take 40 mg by mouth daily.        No current facility-administered medications on file prior to visit.    ROS:  Out of a complete 14 system review of symptoms, the patient complains only of the following symptoms,  and all other reviewed systems are negative.  Fatigue Easy bruising, easy bleeding Memory loss, confusion Depression, insomnia, and disinterest in activities  Blood pressure 137/72, pulse 75, height 5\' 7"  (1.702 m), weight 181 lb (82.101 kg).  Physical Exam  General: The patient is alert and cooperative at the time of the examination.  Skin: No significant peripheral edema is noted.   Neurologic Exam  Mental status: Mini-Mental status examination done today shows a total score of 21/30. The patient is able to name 7 animals in 60 seconds.  Cranial nerves: Facial symmetry is present. Speech is normal, no aphasia or dysarthria is noted. Extraocular movements are full. Visual fields are full.  Motor: The patient has good strength in all 4 extremities.  Coordination: The patient has good finger-nose-finger and heel-to-shin bilaterally. Mild apraxia of the lower extremities is noted.  Gait and  station: The patient has a normal gait. Tandem gait is normal. Romberg is negative. No drift is seen.  Reflexes: Deep tendon reflexes are symmetric.   Assessment/Plan:  1. Memory disturbance  The patient will continue Aricept, and Namenda will be added to his regimen. Prescriptions were sent in to his pharmacy. The patient will followup in 7 or 8 months. The patient appears to have very slow progression of his memory.  Marlan Palau MD 05/29/2013 7:33 PM  [facility] [address] [place], [place] [address]  Phone [phone] Fax [phone]

## 2013-06-21 ENCOUNTER — Other Ambulatory Visit: Payer: Self-pay

## 2013-06-26 ENCOUNTER — Other Ambulatory Visit: Payer: Self-pay

## 2013-06-26 ENCOUNTER — Telehealth: Payer: Self-pay | Admitting: Neurology

## 2013-06-26 MED ORDER — MEMANTINE HCL ER 28 MG PO CP24: 28.0000 mg | ORAL_CAPSULE | Freq: Every day | ORAL | Status: DC

## 2013-06-26 NOTE — Telephone Encounter (Signed)
Spouse came into the office requesting we send Rx for Namenda XR to [facility].  She said she was trying to get it at the local pharmacies here, and no one had it.  I explained the medication is currently on backorder, therefore some pharmacies do not have it in stock.  I gave her samples of the 28mg  along with a voucher for one free month to use if needed.  Dispensed Namenda XR 28mg  #20 Lot [id number] Exp 03/2014.  As well, sent 90 day Rx to [facility].

## 2013-06-26 NOTE — Telephone Encounter (Signed)
The patient should take one capsule daily.  I called back

## 2013-06-26 NOTE — Telephone Encounter (Signed)
The sample boxes are packaged in 5's, but patient should take one cap every day.  I called back and spoke with Kenneth Clay and explained directions.  She verbalized understanding.

## 2013-07-03 ENCOUNTER — Telehealth: Payer: Self-pay | Admitting: Cardiovascular Disease

## 2013-07-03 NOTE — Telephone Encounter (Signed)
Spoke to rep at [facility] [phone]) regarding the denial letter that was sent to Mr.Calico about the gen change he had performed back in February of this year. Per rep---the procedure was not pre approved, but the patient would not be responsible for the remaining balance unless there was a signed agreement stating that he would pay. I informed patient's wife of the information I discovered, and encouraged her to call back if she needed any further assistance. She voiced her understanding, and was thankful for what was done.

## 2013-07-03 NOTE — Telephone Encounter (Signed)
Message forwarded to S. Saunders, CMA r/t device/monitor concerns. 

## 2013-07-03 NOTE — Telephone Encounter (Signed)
Said received 2nd denial letter from [facility] for pacemaker implant  She thinks must have wrong code  Also received the denial letter in July  Apparently has not been cleared up  Please call

## 2013-09-15 ENCOUNTER — Ambulatory Visit (INDEPENDENT_AMBULATORY_CARE_PROVIDER_SITE_OTHER): Payer: Medicare Other

## 2013-09-15 DIAGNOSIS — I442 Atrioventricular block, complete: Secondary | ICD-10-CM

## 2013-09-15 LAB — PACEMAKER DEVICE OBSERVATION

## 2013-09-20 ENCOUNTER — Encounter: Payer: Self-pay | Admitting: *Deleted

## 2013-09-20 LAB — REMOTE PACEMAKER DEVICE
AL AMPLITUDE: 5 mv
AL IMPEDENCE PM: 480 Ohm
BATTERY VOLTAGE: 2.99 V
BRDY-0002RV: 70 {beats}/min
RV LEAD IMPEDENCE PM: 740 Ohm
VENTRICULAR PACING PM: 99

## 2013-09-21 ENCOUNTER — Other Ambulatory Visit: Payer: Self-pay

## 2013-11-26 ENCOUNTER — Other Ambulatory Visit: Payer: Self-pay

## 2013-11-26 MED ORDER — CITALOPRAM HYDROBROMIDE 10 MG PO TABS: 5.0000 mg | ORAL_TABLET | Freq: Every day | ORAL | Status: DC

## 2013-11-26 MED ORDER — DONEPEZIL HCL 10 MG PO TABS: 10.0000 mg | ORAL_TABLET | Freq: Every day | ORAL | Status: DC

## 2013-11-26 NOTE — Telephone Encounter (Signed)
[facility] patient assigned to Dr Jannifer Franklin.  He has been prescribing this med since June 2013.

## 2013-12-13 ENCOUNTER — Telehealth: Payer: Self-pay | Admitting: *Deleted

## 2013-12-13 NOTE — Telephone Encounter (Signed)
Pt is supposed to do a remote check on Monday February 2nd and does not know how to do.   [place]

## 2013-12-13 NOTE — Telephone Encounter (Signed)
Message forwarded to S. Tye Savoy, CMA r/t device/monitor concerns.

## 2013-12-18 ENCOUNTER — Encounter: Payer: Self-pay | Admitting: Cardiovascular Disease

## 2013-12-18 ENCOUNTER — Ambulatory Visit (INDEPENDENT_AMBULATORY_CARE_PROVIDER_SITE_OTHER): Payer: Medicare Other | Admitting: *Deleted

## 2013-12-18 DIAGNOSIS — I442 Atrioventricular block, complete: Secondary | ICD-10-CM

## 2013-12-18 NOTE — Telephone Encounter (Signed)
LMOM for return call//kwm  

## 2013-12-18 NOTE — Telephone Encounter (Signed)
Pt's wife was concerned about getting her husbands remote check done. She does not know how to do it and it is supposed to be done today. Pt called about this last week and still has not received a phone call about this.

## 2013-12-18 NOTE — Telephone Encounter (Signed)
Message routed to [place], [employer]

## 2013-12-21 NOTE — Telephone Encounter (Signed)
Informed patient's wife on 2-3 that the transmission was received and that his transmissions are wireless and automatic. Wife voiced understanding and was satisfied with this.

## 2013-12-26 LAB — MDC_IDC_ENUM_SESS_TYPE_REMOTE
Battery Remaining Longevity: 101 mo
Battery Voltage: 2.98 V
Brady Statistic AS VS Percent: 1 %
Brady Statistic RV Percent Paced: 99 %
Date Time Interrogation Session: 20150202071225
Lead Channel Impedance Value: 450 Ohm
Lead Channel Pacing Threshold Amplitude: 0.75 V
Lead Channel Sensing Intrinsic Amplitude: 6.5 mV
Lead Channel Setting Pacing Amplitude: 1.75 V
MDC IDC MSMT LEADCHNL RA PACING THRESHOLD PULSEWIDTH: 0.4 ms
MDC IDC MSMT LEADCHNL RA SENSING INTR AMPL: 5 mV
MDC IDC MSMT LEADCHNL RV IMPEDANCE VALUE: 710 Ohm
MDC IDC MSMT LEADCHNL RV PACING THRESHOLD AMPLITUDE: 1.5 V
MDC IDC MSMT LEADCHNL RV PACING THRESHOLD PULSEWIDTH: 0.4 ms
MDC IDC PG SERIAL: 7448063
MDC IDC SET LEADCHNL RA PACING AMPLITUDE: 2 V
MDC IDC SET LEADCHNL RV PACING PULSEWIDTH: 0.4 ms
MDC IDC SET LEADCHNL RV SENSING SENSITIVITY: 2 mV
MDC IDC STAT BRADY AP VP PERCENT: 99 %
MDC IDC STAT BRADY AP VS PERCENT: 1 %
MDC IDC STAT BRADY AS VP PERCENT: 1 %
MDC IDC STAT BRADY RA PERCENT PACED: 99 %

## 2014-01-04 ENCOUNTER — Ambulatory Visit (INDEPENDENT_AMBULATORY_CARE_PROVIDER_SITE_OTHER): Payer: Medicare Other | Admitting: Neurology

## 2014-01-04 ENCOUNTER — Encounter: Payer: Self-pay | Admitting: Neurology

## 2014-01-04 VITALS — BP 148/87 | HR 104 | Wt 182.0 lb

## 2014-01-04 DIAGNOSIS — G309 Alzheimer's disease, unspecified: Principal | ICD-10-CM

## 2014-01-04 DIAGNOSIS — F0280 Dementia in other diseases classified elsewhere without behavioral disturbance: Secondary | ICD-10-CM

## 2014-01-04 NOTE — Progress Notes (Signed)
Reason for visit: Memory disorder  Kenneth Clay is an 80 y.o. male  History of present illness:  Kenneth Clay is a 80 year old right-handed white male with a history of a slowly progressive memory disorder. The patient has not had any significant changes in his functional ability since last seen. The patient is on a combination of Aricept and Namenda, and he is tolerating medications well. The patient is resting well at night, and he reports a good energy level during the day. The patient has not had any new medical issues that have come up since last seen. The patient returns for an evaluation.  Past Medical History  Diagnosis Date  . Pacemaker     new gen. last check with insert 01/03/13  . Dementia   . Ulcer   . Hypertension   . Sinus node dysfunction   . Hyperlipidemia   . LV dysfunction     last echo-04/30/10, EF 50-55%, impaired LV relaxation  . H/O cardiovascular stress test 04/30/2010    Persantine myoview-normal  . H/O: GI bleed   . Depression     Past Surgical History  Procedure Laterality Date  . Pacemaker insertion  1998  . Pacemaker generator change  2005    [place] DR  . Pacemaker generator change  01/03/2013    ST. Jude Accent DR RF  . Rotator cuff repair      bilateral  . US echocardiography  04/30/10    aortic root sclerotic,RV mildly dilated    Family History  Problem Relation Age of Onset  . Diabetes Mother   . Heart disease Father   . Diabetes Brother     Social history:  reports that he quit smoking about 9 years ago. He has never used smokeless tobacco. He reports that he does not drink alcohol or use illicit drugs.   No Known Allergies  Medications:  Current Outpatient Prescriptions on File Prior to Visit  Medication Sig Dispense Refill  . aspirin EC 81 MG tablet Take 81 mg by mouth daily.      . benazepril (LOTENSIN) 20 MG tablet Take 20 mg by mouth daily.        . citalopram (CELEXA) 10 MG tablet Take 0.5 tablets (5 mg  total) by mouth daily.  15 tablet  1  . donepezil (ARICEPT) 10 MG tablet Take 1 tablet (10 mg total) by mouth daily.  30 tablet  1  . folic acid (FOLVITE) 194 MCG tablet Take 400 mcg by mouth daily.      Marland Kitchen gemfibrozil (LOPID) 600 MG tablet Take 300 mg by mouth 2 (two) times daily before a meal.       . Multiple Vitamin (MULTIVITAMIN WITH MINERALS) TABS Take 1 tablet by mouth daily.      Marland Kitchen omega-3 acid ethyl esters (LOVAZA) 1 G capsule Take 1 g by mouth daily.      . pantoprazole (PROTONIX) 40 MG tablet Take 40 mg by mouth daily.       . Memantine HCl ER (NAMENDA XR) 28 MG CP24 Take 28 mg by mouth at bedtime.  90 capsule  1   No current facility-administered medications on file prior to visit.    ROS:  Out of a complete 14 system review of symptoms, the patient complains only of the following symptoms, and all other reviewed systems are negative.  Fatigue Hearing loss Confusion  Blood pressure 148/87, pulse 104, weight 182 lb (82.555 kg).  Physical Exam  General: The patient is alert and cooperative at the time of the examination.  Skin: No significant peripheral edema is noted.   Neurologic Exam  Mental status: The Mini-Mental status examination done today shows a total score of 22/30.  Cranial nerves: Facial symmetry is present. Speech is normal, no aphasia or dysarthria is noted. Extraocular movements are full. Visual fields are full.  Motor: The patient has good strength in all 4 extremities.  Sensory examination: Soft touch sensation is symmetric on the face, arms, and legs  Coordination: The patient has good finger-nose-finger and heel-to-shin bilaterally.  Gait and station: The patient has a normal gait. Tandem gait is normal. Romberg is negative. No drift is seen.  Reflexes: Deep tendon reflexes are symmetric.   Assessment/Plan:  1. Memory disturbance  The patient will continue the Aricept and Namenda for now. He will followup in one year. The patient appears  to be relatively stable on his Mini-Mental status examinations.  Jill Alexanders MD 01/04/2014 8:41 PM  [facility] [address] [place], [place] [address]  Phone [phone] Fax [phone]

## 2014-01-15 ENCOUNTER — Encounter: Payer: Self-pay | Admitting: *Deleted

## 2014-02-26 ENCOUNTER — Other Ambulatory Visit: Payer: Self-pay

## 2014-02-26 MED ORDER — DONEPEZIL HCL 10 MG PO TABS: 10.0000 mg | ORAL_TABLET | Freq: Every day | ORAL | Status: DC

## 2014-02-26 MED ORDER — MEMANTINE HCL ER 28 MG PO CP24: 28.0000 mg | ORAL_CAPSULE | Freq: Every day | ORAL | Status: DC

## 2014-03-19 ENCOUNTER — Telehealth: Payer: Self-pay | Admitting: Cardiovascular Disease

## 2014-03-20 NOTE — Telephone Encounter (Signed)
Closed encounter °

## 2014-03-23 ENCOUNTER — Ambulatory Visit (INDEPENDENT_AMBULATORY_CARE_PROVIDER_SITE_OTHER): Payer: Medicare Other | Admitting: *Deleted

## 2014-03-23 ENCOUNTER — Encounter: Payer: Medicare Other | Admitting: Cardiovascular Disease

## 2014-03-23 VITALS — BP 112/70

## 2014-03-23 DIAGNOSIS — I442 Atrioventricular block, complete: Secondary | ICD-10-CM

## 2014-03-23 LAB — MDC_IDC_ENUM_SESS_TYPE_INCLINIC
Battery Remaining Longevity: 109.2 mo
Battery Voltage: 2.98 V
Brady Statistic RV Percent Paced: 99.96 %
Date Time Interrogation Session: 20150508161743
Lead Channel Impedance Value: 525 Ohm
Lead Channel Pacing Threshold Amplitude: 0.75 V
Lead Channel Pacing Threshold Pulse Width: 0.4 ms
Lead Channel Sensing Intrinsic Amplitude: 5 mV
Lead Channel Setting Sensing Sensitivity: 2 mV
MDC IDC MSMT LEADCHNL RA PACING THRESHOLD AMPLITUDE: 0.75 V
MDC IDC MSMT LEADCHNL RA PACING THRESHOLD PULSEWIDTH: 0.4 ms
MDC IDC MSMT LEADCHNL RV IMPEDANCE VALUE: 600 Ohm
MDC IDC MSMT LEADCHNL RV PACING THRESHOLD AMPLITUDE: 1.125 V
MDC IDC MSMT LEADCHNL RV PACING THRESHOLD PULSEWIDTH: 0.4 ms
MDC IDC MSMT LEADCHNL RV SENSING INTR AMPL: 6.5 mV
MDC IDC PG SERIAL: 7448063
MDC IDC SET LEADCHNL RA PACING AMPLITUDE: 2 V
MDC IDC SET LEADCHNL RV PACING AMPLITUDE: 1.5 V
MDC IDC SET LEADCHNL RV PACING PULSEWIDTH: 0.4 ms
MDC IDC STAT BRADY RA PERCENT PACED: 99.65 %

## 2014-03-23 LAB — PACEMAKER DEVICE OBSERVATION

## 2014-03-23 NOTE — Progress Notes (Signed)
Pacemaker check in clinic. Normal device function. Thresholds, sensing, impedances consistent with previous measurements. Device programmed to maximize longevity. 63 mode switches (<1%)---max dur. 16 sec, Max A 415, Max V 97---AT/AFL/Sensor related competative pacing. No high ventricular rates noted. 14 "PMT" episodes---appears SVT. Retrograde conduction test performed---VA conduction present at 213ms--PVARP 348ms. Device programmed at appropriate safety margins. Histogram distribution appropriate for patient activity level. Device programmed to optimize intrinsic conduction. Estimated longevity 9.1-10.2 years. Patient will follow up with [facility] in 3 months.

## 2014-03-23 NOTE — Patient Instructions (Signed)
Follow up with Dr.Croitoru on 06-26-2014 @ 10:15am.

## 2014-05-29 ENCOUNTER — Telehealth: Payer: Self-pay | Admitting: Neurology

## 2014-05-29 MED ORDER — MEMANTINE HCL ER 28 MG PO CP24: 28.0000 mg | ORAL_CAPSULE | Freq: Every day | ORAL | Status: DC

## 2014-05-29 NOTE — Telephone Encounter (Signed)
Patient's spouse called stating that their were paying $75.00 Namenda 28 mg and since it's gone up to $182.00. Patient's spouse wants to get Dr. Tobey Grim opinion, if it's necessary for her husband to have this medication before she calls the refill into the pharmacy.

## 2014-05-29 NOTE — Telephone Encounter (Signed)
Please advise 

## 2014-05-29 NOTE — Telephone Encounter (Signed)
Patient's co-pay may have increased due to a change in ins or could be in the donut hole.  I called back.  Spoke with Ms. Lisanti at great length.  The cost mentioned is for a 90 day Rx via mail order.  I gave her the number to [employer] at [phone] to see if they qualify for the program.  She will contact them.  In the meantime, we will send a one month Rx to local pharmacy with voucher for 30 days of meds at no charge.  Spouse was agreeable to this plan and will call us back if they do not qualify for assistance, otherwise they will proceed with getting meds from [facility].

## 2014-06-06 ENCOUNTER — Telehealth: Payer: Self-pay | Admitting: Neurology

## 2014-06-06 MED ORDER — MEMANTINE HCL ER 28 MG PO CP24: 28.0000 mg | ORAL_CAPSULE | Freq: Every day | ORAL | Status: DC

## 2014-06-06 NOTE — Telephone Encounter (Signed)
Patient's daughter called stating her mother stated to her that Dr. Jannifer Franklin wants to stop Namenda. I informed the patient's daughter that Dr. Jannifer Franklin has not document in any notes to stop taking Namenda. Patient's daughter understood and stated her mother will be going to her pcp for possible Aricept changes. Patient's daughter stated that her mother paid $30.00 for Namenda and I told her that per the phone note there's was a voucher with the Rx and it should have been "no charge."  I will have Janett Billow to find out why patient's spouse was charged for [employer].

## 2014-06-06 NOTE — Telephone Encounter (Signed)
I called the pharmacy.  Spoke with [employer].  She said they saw the note saying to bill discount card, but they did not bill it.  She did reprocess the Rx with info from voucher and said the patient has apparently used a voucher at some point previously, and they rejected the claim saying he could only use one voucher per lifetime.  I called the daughter back.  Explained what the pharmacy said.  She verbalized understanding and asked hat future refills be sent to [facility].  Told her we will be happy to send Rx.  She is aware ins determines co-pay amount.  Says if co-pay goes up, she does not feel they would qualify for assistance from [employer] due to the amount of income they get.  She will call us back if anything further is needed.

## 2014-06-14 ENCOUNTER — Telehealth: Payer: Self-pay | Admitting: *Deleted

## 2014-06-14 NOTE — Telephone Encounter (Signed)
Calling patient to r/s his appointment with NP CM on 01/01/14, left message to return the call to r/s appointment with NP MM. [facility] patient never seen by CM before.

## 2014-06-26 ENCOUNTER — Encounter: Payer: Self-pay | Admitting: Cardiovascular Disease

## 2014-06-26 ENCOUNTER — Ambulatory Visit (INDEPENDENT_AMBULATORY_CARE_PROVIDER_SITE_OTHER): Payer: Medicare Other | Admitting: Cardiovascular Disease

## 2014-06-26 VITALS — BP 124/70 | HR 82 | Resp 20 | Ht 67.5 in | Wt 182.8 lb

## 2014-06-26 DIAGNOSIS — E785 Hyperlipidemia, unspecified: Secondary | ICD-10-CM

## 2014-06-26 DIAGNOSIS — I442 Atrioventricular block, complete: Secondary | ICD-10-CM

## 2014-06-26 DIAGNOSIS — Z950 Presence of cardiac pacemaker: Secondary | ICD-10-CM

## 2014-06-26 DIAGNOSIS — I10 Essential (primary) hypertension: Secondary | ICD-10-CM

## 2014-06-26 NOTE — Assessment & Plan Note (Addendum)
Time for repeat lipid profile. Followup appointment with PCP next month

## 2014-06-26 NOTE — Progress Notes (Signed)
Patient ID: Kenneth Clay, male   DOB: 1932/06/01, 80 y.o.   MRN: [id number]     Reason for office visit Complete heart block, sinus node arrest, dual chamber permanent pacemaker  Mr. Discher is an 80 year old gentleman without known structural heart disease who first received a permanent pacemaker in 1998 with subsequent generator changes in 2005 and 2014. He seems to have both sinus node arrest and complete heart block and is pacemaker dependent. His current device is a Buyer, retail RF dual chamber pacemaker with the original lead still in use.  He has normal left ventricular systolic function and a low-risk myocardial perfusion scan June 2011. Has mild and well controlled systemic hypertension, hypertriglyceridemia and takes both Aricept and Namenda for worsening dementia.  The only complaint today is slight lack of energy. Pacemaker interrogation shows normal device function without any intrinsic activity in either the atrium or the ventricle and 100% pacing in both chambers. Rare episodes of atrial tachyarrhythmia recorded by pacemaker, the longest being only 8 seconds in duration. Rate response reaction time was changed to "fast" today for his complaints of fatigue.  No Known Allergies  Current Outpatient Prescriptions  Medication Sig Dispense Refill  . aspirin EC 81 MG tablet Take 81 mg by mouth daily.      . benazepril (LOTENSIN) 20 MG tablet Take 20 mg by mouth daily.        . citalopram (CELEXA) 10 MG tablet Take 0.5 tablets (5 mg total) by mouth daily.  15 tablet  1  . donepezil (ARICEPT) 10 MG tablet Take 1 tablet (10 mg total) by mouth daily.  90 tablet  3  . folic acid (FOLVITE) 767 MCG tablet Take 400 mcg by mouth daily.      Marland Kitchen gemfibrozil (LOPID) 600 MG tablet Take 300 mg by mouth 2 (two) times daily before a meal.       . Memantine HCl ER (NAMENDA XR) 28 MG CP24 Take 28 mg by mouth daily.  30 capsule  6  . Multiple Vitamin (MULTIVITAMIN WITH MINERALS) TABS Take 1 tablet by  mouth daily.      . Omega-3 Fatty Acids (FISH OIL TRIPLE STRENGTH) 1400 MG CAPS Take 1,400 mg by mouth daily.      . pantoprazole (PROTONIX) 40 MG tablet Take 40 mg by mouth daily.        No current facility-administered medications for this visit.    Past Medical History  Diagnosis Date  . Pacemaker     new gen. last check with insert 01/03/13  . Dementia   . Ulcer   . Hypertension   . Sinus node dysfunction   . Hyperlipidemia   . LV dysfunction     last echo-04/30/10, EF 50-55%, impaired LV relaxation  . H/O cardiovascular stress test 04/30/2010    Persantine myoview-normal  . H/O: GI bleed   . Depression     Past Surgical History  Procedure Laterality Date  . Pacemaker insertion  1998  . Pacemaker generator change  2005    [place] DR  . Pacemaker generator change  01/03/2013    [employer] Jude [facility] DR RF  . Rotator cuff repair      bilateral  . US echocardiography  04/30/10    aortic root sclerotic,RV mildly dilated    Family History  Problem Relation Age of Onset  . Diabetes Mother   . Heart disease Father   . Diabetes Brother     History  Social History  . Marital Status: Married    Spouse Name: [name]    Number of Children: 3  . Years of Education: N/A   Occupational History  . fire fighter-retired    Social History Main Topics  . Smoking status: Former Smoker    Quit date: 11/16/2004  . Smokeless tobacco: Never Used  . Alcohol Use: No  . Drug Use: No  . Sexual Activity: Not on file   Other Topics Concern  . Not on file   Social History Narrative  . No narrative on file    Review of systems: The patient specifically denies any chest pain at rest or with exertion, dyspnea at rest or with exertion, orthopnea, paroxysmal nocturnal dyspnea, syncope, palpitations, focal neurological deficits, intermittent claudication, lower extremity edema, unexplained weight gain, cough, hemoptysis or wheezing.  The patient also denies abdominal pain,  nausea, vomiting, dysphagia, diarrhea, constipation, polyuria, polydipsia, dysuria, hematuria, frequency, urgency, abnormal bleeding or bruising, fever, chills, unexpected weight changes, mood swings, change in skin or hair texture, change in voice quality, auditory or visual problems, allergic reactions or rashes, new musculoskeletal complaints other than usual "aches and pains".   PHYSICAL EXAM BP 124/70  Pulse 82  Resp 20  Ht 5' 7.5" (1.715 m)  Wt 182 lb 12.8 oz (82.918 kg)  BMI 28.19 kg/m2  General: Alert, oriented x3, no distress Head: no evidence of trauma, PERRL, EOMI, no exophtalmos or lid lag, no myxedema, no xanthelasma; normal ears, nose and oropharynx Neck: normal jugular venous pulsations and no hepatojugular reflux; brisk carotid pulses without delay and no carotid bruits Chest: clear to auscultation, no signs of consolidation by percussion or palpation, normal fremitus, symmetrical and full respiratory excursions Cardiovascular: normal position and quality of the apical impulse, regular rhythm, normal first and second heart sounds, early peaking 1/6 systolic aortic ejection murmur, rubs or gallops Abdomen: no tenderness or distention, no masses by palpation, no abnormal pulsatility or arterial bruits, normal bowel sounds, no hepatosplenomegaly Extremities: no clubbing, cyanosis or edema; 2+ radial, ulnar and brachial pulses bilaterally; 2+ right femoral, posterior tibial and dorsalis pedis pulses; 2+ left femoral, posterior tibial and dorsalis pedis pulses; no subclavian or femoral bruits Neurological: grossly nonfocal   EKG: Atrial paced-ventricular paced  Lipid Panel  No results found for this basename: chol, trig, hdl, cholhdl, vldl, ldlcalc    BMET    Component Value Date/Time   NA 143 07/12/2011 0630   K 3.6 07/12/2011 0630   CL 113* 07/12/2011 0630   CO2 25 07/12/2011 0630   GLUCOSE 101* 07/12/2011 0630   BUN 12 07/12/2011 0630   CREATININE 0.79 07/12/2011 0630    CALCIUM 8.6 07/12/2011 0630   GFRNONAA >60 07/12/2011 0630   GFRAA >60 07/12/2011 0630     ASSESSMENT AND PLAN Pacemaker for sinus node dysfunction-'98, '05- Gen change 01/03/13 ([employer] Jude) Normally functioning device. He is pacemaker dependent. Continue remote monitoring every 3 months and office appointments yearly.  Dyslipidemia Time for repeat lipid profile. Followup appointment with PCP next month  HTN (hypertension) Good control   Patient Instructions  Remote monitoring is used to monitor your pacemaker from home. This monitoring reduces the number of office visits required to check your device to one time per year. It allows [place] to keep an eye on the functioning of your device to ensure it is working properly. You are scheduled for a device check from home on 09-27-2014. You may send your transmission at any time that day. If  you have a wireless device, the transmission will be sent automatically. After your physician reviews your transmission, you will receive a postcard with your next transmission date.  Your physician recommends that you schedule a follow-up appointment in: 12 months with Dr.Leibish Mcgregor     Orders Placed This Encounter  Procedures  . EKG 12-Lead   No orders of the defined types were placed in this encounter.    Holli Humbles, MD, [place] [phone] office [phone] pager

## 2014-06-26 NOTE — Assessment & Plan Note (Signed)
Good control

## 2014-06-26 NOTE — Patient Instructions (Signed)
Remote monitoring is used to monitor your pacemaker from home. This monitoring reduces the number of office visits required to check your device to one time per year. It allows [place] to keep an eye on the functioning of your device to ensure it is working properly. You are scheduled for a device check from home on 09-27-2014. You may send your transmission at any time that day. If you have a wireless device, the transmission will be sent automatically. After your physician reviews your transmission, you will receive a postcard with your next transmission date.  Your physician recommends that you schedule a follow-up appointment in: 12 months with Dr.Croitoru

## 2014-06-26 NOTE — Assessment & Plan Note (Signed)
Normally functioning device. He is pacemaker dependent. Continue remote monitoring every 3 months and office appointments yearly.

## 2014-07-02 ENCOUNTER — Ambulatory Visit: Payer: Medicare Other | Admitting: Cardiovascular Disease

## 2014-07-04 LAB — MDC_IDC_ENUM_SESS_TYPE_INCLINIC
Battery Remaining Percentage: 95 %
Brady Statistic RA Percent Paced: 99 %
Brady Statistic RV Percent Paced: 99 %
Implantable Pulse Generator Serial Number: 7448063
Lead Channel Setting Pacing Amplitude: 1.5 V
Lead Channel Setting Pacing Pulse Width: 0.4 ms
Lead Channel Setting Sensing Sensitivity: 2 mV
MDC IDC MSMT BATTERY VOLTAGE: 2.98 V
MDC IDC MSMT LEADCHNL RA IMPEDANCE VALUE: 460 Ohm
MDC IDC MSMT LEADCHNL RA PACING THRESHOLD AMPLITUDE: 0.75 V
MDC IDC MSMT LEADCHNL RA PACING THRESHOLD PULSEWIDTH: 0.4 ms
MDC IDC MSMT LEADCHNL RV IMPEDANCE VALUE: 590 Ohm
MDC IDC MSMT LEADCHNL RV PACING THRESHOLD AMPLITUDE: 1.125 V
MDC IDC MSMT LEADCHNL RV PACING THRESHOLD PULSEWIDTH: 0.4 ms
MDC IDC SET LEADCHNL RA PACING AMPLITUDE: 2 V

## 2014-07-13 ENCOUNTER — Encounter: Payer: Self-pay | Admitting: Gastroenterology

## 2014-07-30 ENCOUNTER — Encounter: Payer: Self-pay | Admitting: Cardiovascular Disease

## 2014-08-20 ENCOUNTER — Telehealth: Payer: Self-pay | Admitting: Nurse Practitioner

## 2014-08-20 NOTE — Telephone Encounter (Signed)
Wife states Namenda cannot be refilled for her husband without costing a lot of money (over $300.) They are in the [place] donut hole and would like suggestions or help getting a discount on it. The best number to call is [phone] and it is okay to leave a message at that number.

## 2014-08-20 NOTE — Telephone Encounter (Signed)
Janett Billow can you take care of this

## 2014-08-20 NOTE — Telephone Encounter (Signed)
Kenneth Clay, please refer to the note below and advise what you want the patient to do. So i can call and relay.

## 2014-08-21 ENCOUNTER — Telehealth: Payer: Self-pay | Admitting: Nurse Practitioner

## 2014-08-21 MED ORDER — MEMANTINE HCL 10 MG PO TABS
10.0000 mg | ORAL_TABLET | Freq: Two times a day (BID) | ORAL | Status: DC
Start: 2014-08-21 — End: 2015-09-13

## 2014-08-21 NOTE — Telephone Encounter (Signed)
Spouse returning [place] call.

## 2014-08-21 NOTE — Telephone Encounter (Signed)
Rx has been updated and sent.  When I spoke with Kenneth Clay previously, she said she would turn her answering machine on.  I called back.  Got no answer.  Left message.

## 2014-08-21 NOTE — Telephone Encounter (Signed)
Patient's spouse requesting Kenneth Clay return call regarding Rx Assistance Program.  Please return call @ [phone].

## 2014-08-21 NOTE — Telephone Encounter (Signed)
Ok with me. thanks 

## 2014-08-21 NOTE — Telephone Encounter (Signed)
Called again.  Got no answer, no voicemail.

## 2014-08-21 NOTE — Telephone Encounter (Signed)
I called back.  Provided number for Patient Assist of [phone].  They will contact the program to see if they qualify and will call us back if needed.

## 2014-08-21 NOTE — Telephone Encounter (Signed)
I called back again.  Spoke with Kenneth Clay.  Although I provided info for PAP, she would like to know if the Rx can be changed from Namenda XR 28mg  once daily to Namenda 10mg  BID.  The XR will cost $300 per month, and regular release will cost $13 per month.  I will be happy to update/send Rx if permissible.  Please advise.  Thank you.

## 2014-08-21 NOTE — Telephone Encounter (Signed)
I called back, got no answer.  No voicemail.

## 2014-09-27 ENCOUNTER — Encounter: Payer: Self-pay | Admitting: Cardiovascular Disease

## 2014-09-27 ENCOUNTER — Ambulatory Visit (INDEPENDENT_AMBULATORY_CARE_PROVIDER_SITE_OTHER): Payer: Medicare Other | Admitting: *Deleted

## 2014-09-27 DIAGNOSIS — I442 Atrioventricular block, complete: Secondary | ICD-10-CM

## 2014-09-27 LAB — MDC_IDC_ENUM_SESS_TYPE_REMOTE
Battery Remaining Longevity: 100 mo
Battery Remaining Percentage: 91 %
Brady Statistic AP VS Percent: 1 %
Brady Statistic AS VP Percent: 1 %
Brady Statistic RA Percent Paced: 99 %
Brady Statistic RV Percent Paced: 99 %
Date Time Interrogation Session: 20151112085329
Implantable Pulse Generator Model: 2210
Lead Channel Impedance Value: 480 Ohm
Lead Channel Impedance Value: 580 Ohm
Lead Channel Pacing Threshold Amplitude: 0.75 V
Lead Channel Pacing Threshold Pulse Width: 0.4 ms
Lead Channel Sensing Intrinsic Amplitude: 5 mV
Lead Channel Setting Pacing Amplitude: 1.875
MDC IDC MSMT BATTERY VOLTAGE: 2.98 V
MDC IDC MSMT LEADCHNL RV PACING THRESHOLD AMPLITUDE: 1.625 V
MDC IDC MSMT LEADCHNL RV PACING THRESHOLD PULSEWIDTH: 0.4 ms
MDC IDC MSMT LEADCHNL RV SENSING INTR AMPL: 6.5 mV
MDC IDC PG SERIAL: 7448063
MDC IDC SET LEADCHNL RA PACING AMPLITUDE: 2 V
MDC IDC SET LEADCHNL RV PACING PULSEWIDTH: 0.4 ms
MDC IDC SET LEADCHNL RV SENSING SENSITIVITY: 2 mV
MDC IDC STAT BRADY AP VP PERCENT: 99 %
MDC IDC STAT BRADY AS VS PERCENT: 1 %

## 2014-09-28 NOTE — Progress Notes (Signed)
Remote pacemaker transmission.   

## 2014-10-21 ENCOUNTER — Encounter (HOSPITAL_COMMUNITY): Payer: Self-pay | Admitting: *Deleted

## 2014-10-21 ENCOUNTER — Emergency Department (HOSPITAL_COMMUNITY)
Admission: EM | Admit: 2014-10-21 | Discharge: 2014-10-21 | Disposition: A | Discharge status: Home / Self Care | Payer: Medicare Other | Source: Home / Self Care | Attending: Family Medicine | Admitting: Family Medicine

## 2014-10-21 ENCOUNTER — Emergency Department (INDEPENDENT_AMBULATORY_CARE_PROVIDER_SITE_OTHER): Payer: Medicare Other

## 2014-10-21 DIAGNOSIS — S63501A Unspecified sprain of right wrist, initial encounter: Secondary | ICD-10-CM

## 2014-10-21 DIAGNOSIS — W19XXXA Unspecified fall, initial encounter: Secondary | ICD-10-CM

## 2014-10-21 MED ORDER — IBUPROFEN 800 MG PO TABS
ORAL_TABLET | ORAL | Status: AC
  Filled 2014-10-21: qty 1

## 2014-10-21 MED ORDER — IBUPROFEN 800 MG PO TABS
800.0000 mg | ORAL_TABLET | Freq: Once | ORAL | Status: AC
  Administered 2014-10-21: 800 mg via ORAL

## 2014-10-21 NOTE — ED Provider Notes (Signed)
CSN: [id number]     Arrival date & time 10/21/14  1039 History   First MD Initiated Contact with Patient 10/21/14 1121     Chief Complaint  Patient presents with  . Wrist Injury   (Consider location/radiation/quality/duration/timing/severity/associated sxs/prior Treatment) Patient is a 80 y.o. male presenting with wrist injury. The history is provided by the patient.  Wrist Injury Location:  Wrist Time since incident:  1 day Injury: yes   Mechanism of injury comment:  Ladder on which he was standing to hang some Christmas decorations fell over and he injured right wrist during the fall.  Wrist location:  R wrist   Past Medical History  Diagnosis Date  . Pacemaker     new gen. last check with insert 01/03/13  . Dementia   . Ulcer   . Hypertension   . Sinus node dysfunction   . Hyperlipidemia   . LV dysfunction     last echo-04/30/10, EF 50-55%, impaired LV relaxation  . H/O cardiovascular stress test 04/30/2010    Persantine myoview-normal  . H/O: GI bleed   . Depression    Past Surgical History  Procedure Laterality Date  . Pacemaker insertion  1998  . Pacemaker generator change  2005    [place] DR  . Pacemaker generator change  01/03/2013    [employer]. Jude Acc[facility] DR RF  . Rotator cuff repair      bilateral  . US echocardiography  04/30/10    aortic root sclerotic,RV mildly dilated   Family History  Problem Relation Age of Onset  . Diabetes Mother   . Heart disease Father   . Diabetes Brother    History  Substance Use Topics  . Smoking status: Former Smoker    Quit date: 11/16/2004  . Smokeless tobacco: Never Used  . Alcohol Use: No    Review of Systems  All other systems reviewed and are negative.   Allergies  Review of patient's allergies indicates no known allergies.  Home Medications   Prior to Admission medications   Medication Sig Start Date End Date Taking? Authorizing Provider  aspirin EC 81 MG tablet Take 81 mg by mouth daily.   Yes  Historical Provider, MD  benazepril (LOTENSIN) 20 MG tablet Take 20 mg by mouth daily.     Yes Historical Provider, MD  citalopram (CELEXA) 10 MG tablet Take 0.5 tablets (5 mg total) by mouth daily. 11/26/13  Yes Kathrynn Ducking, MD  donepezil (ARICEPT) 10 MG tablet Take 1 tablet (10 mg total) by mouth daily. 02/26/14  Yes Kathrynn Ducking, MD  folic acid (FOLVITE) 888 MCG tablet Take 400 mcg by mouth daily.   Yes Historical Provider, MD  gemfibrozil (LOPID) 600 MG tablet Take 300 mg by mouth 2 (two) times daily before a meal.    Yes Historical Provider, MD  memantine (NAMENDA) 10 MG tablet Take 1 tablet (10 mg total) by mouth 2 (two) times daily. 08/21/14  Yes Dennie Bible, NP  Multiple Vitamin (MULTIVITAMIN WITH MINERALS) TABS Take 1 tablet by mouth daily.   Yes Historical Provider, MD  Omega-3 Fatty Acids (FISH OIL TRIPLE STRENGTH) 1400 MG CAPS Take 1,400 mg by mouth daily.   Yes Historical Provider, MD  pantoprazole (PROTONIX) 40 MG tablet Take 40 mg by mouth daily.    Yes Historical Provider, MD   BP 170/87 mmHg  Pulse 79  Temp(Src) 97.5 F (36.4 C)  Resp 16  SpO2 97% Physical Exam  Constitutional: He  is oriented to person, place, and time. He appears well-developed and well-nourished. No distress.  HENT:  Head: Normocephalic and atraumatic.  Cardiovascular: Normal rate.   Pulmonary/Chest: Effort normal.  Musculoskeletal: Normal range of motion.       Right wrist: He exhibits tenderness. He exhibits normal range of motion, no bony tenderness, no swelling, no effusion, no crepitus, no deformity and no laceration.       Arms: CSM exam of right hand normal. Radial pulse intact  Neurological: He is alert and oriented to person, place, and time.  Skin: Skin is warm and dry. No rash noted. No erythema.  +skin intact  Psychiatric: He has a normal mood and affect. His behavior is normal.  Nursing note and vitals reviewed.   ED Course  Procedures (including critical care  time) Labs Review Labs Reviewed - No data to display  Imaging Review Dg Wrist Complete Right  10/21/2014   CLINICAL DATA:  Fall off ladder yesterday  EXAM: RIGHT WRIST - COMPLETE 3+ VIEW  COMPARISON:  None.  FINDINGS: No acute fracture.  No dislocation.  Unremarkable soft tissues.  IMPRESSION: No acute bony pathology.   Electronically Signed   By: Maryclare Bean M.D.   On: 10/21/2014 12:19     MDM   1. Wrist sprain, right, initial encounter   2. Fall   Films negative Provided with velcro wrist splint and ice pack Tylenol as directed on packaging for pain Expect improvement over next 1-2 weeks If no improvement, ortho follow up.  Splint as needed for comfort  Lutricia Feil, PA 10/21/14 [phone]

## 2014-10-21 NOTE — ED Notes (Addendum)
Was putting up a swag in living room and fell off a 6 ft. Ladder- was midway up, when ladder tipped over.  C/o pain R wrist.  Pain worse today. Slightly swollen on ventral surface.  R ppp.

## 2014-10-21 NOTE — Discharge Instructions (Signed)
Films without evidence of fracture or dislocation velcro wrist splint and ice pack as needed for comfort Tylenol as directed on packaging for pain Expect improvement over next 1-2 weeks If no improvement, ortho follow up as listed on your paperwork Splint as needed for comfort Wrist Splint A wrist splint is a brace that holds your wrist in a fixed position. It can be used to stabilize your wrist so that broken bones and sprains can heal faster, with less pain. It can also help to relieve pressure on the nerve that runs down the middle of your arm (median nerve). Splints are available in drugstores without a prescription. They are also available by prescription from orthopedic and medical supply stores. Custom splints made from lightweight materials can be made by physical or occupational therapist. [place] your splint as instructed by your caregiver. It may be worn while you sleep.  Your caregiver may instruct you how to perform certain exercises at home. These exercises help maintain muscle strength in your hand and wrist. They also help to maintain motion in your fingers. SEEK MEDICAL CARE IF:  You start to lose feeling in your hand or fingers.  Your skin or fingernails turn blue or gray, or they feel cold. MAKE SURE YOU:   Understand these instructions.  Will watch your condition.  Will get help right away if you are not doing well or get worse. Document Released: 10/15/2006 Document Revised: 01/25/2012 Document Reviewed: 02/13/2014 [facility] Patient Information 2015 [place], [place]. This information is not intended to replace advice given to you by your health care provider. Make sure you discuss any questions you have with your health care provider.  Wrist Sprain with Rehab A sprain is an injury in which a ligament that maintains the proper alignment of a joint is partially or completely torn. The ligaments of the wrist are susceptible to sprains. Sprains are  classified into three categories. Grade 1 sprains cause pain, but the tendon is not lengthened. Grade 2 sprains include a lengthened ligament because the ligament is stretched or partially ruptured. With grade 2 sprains there is still function, although the function may be diminished. Grade 3 sprains are characterized by a complete tear of the tendon or muscle, and function is usually impaired. SYMPTOMS   Pain tenderness, inflammation, and/or bruising (contusion) of the injury.  A "pop" or tear felt and/or heard at the time of injury.  Decreased wrist function. CAUSES  A wrist sprain occurs when a force is placed on one or more ligaments that is greater than it/they can withstand. Common mechanisms of injury include:  Catching a ball with you hands.  Repetitive and/ or strenuous extension or flexion of the wrist. RISK INCREASES WITH:  Previous wrist injury.  Contact sports (boxing or wrestling).  Activities in which falling is common.  Poor strength and flexibility.  Improperly fitted or padded protective equipment. PREVENTION  Warm up and stretch properly before activity.  Allow for adequate recovery between workouts.  Maintain physical fitness:  Strength, flexibility, and endurance.  Cardiovascular fitness.  Protect the wrist joint by limiting its motion with the use of taping, braces, or splints.  Protect the wrist after injury for 6 to 12 months. PROGNOSIS  The prognosis for wrist sprains depends on the degree of injury. Grade 1 sprains require 2 to 6 weeks of treatment. Grade 2 sprains require 6 to 8 weeks of treatment, and grade 3 sprains require up to 12 weeks.  RELATED COMPLICATIONS  Prolonged healing time, if improperly treated or re-injured.  Recurrent symptoms that result in a chronic problem.  Injury to nearby structures (bone, cartilage, nerves, or tendons).  Arthritis of the wrist.  Inability to compete in athletics at a high level.  Wrist  stiffness or weakness.  Progression to a complete rupture of the ligament. TREATMENT  Treatment initially involves resting from any activities that aggravate the symptoms, and the use of ice and medications to help reduce pain and inflammation. Your caregiver may recommend immobilizing the wrist for a period of time in order to reduce stress on the ligament and allow for healing. After immobilization it is important to perform strengthening and stretching exercises to help regain strength and a full range of motion. These exercises may be completed at home or with a therapist. Surgery is not usually required for wrist sprains, unless the ligament has been ruptured (grade 3 sprain). MEDICATION   If pain medication is necessary, then nonsteroidal anti-inflammatory medications, such as aspirin and ibuprofen, or other minor pain relievers, such as acetaminophen, are often recommended.  Do not take pain medication for 7 days before surgery.  Prescription pain relievers may be given if deemed necessary by your caregiver. Use only as directed and only as much as you need. HEAT AND COLD  Cold treatment (icing) relieves pain and reduces inflammation. Cold treatment should be applied for 10 to 15 minutes every 2 to 3 hours for inflammation and pain and immediately after any activity that aggravates your symptoms. Use ice packs or massage the area with a piece of ice (ice massage).  Heat treatment may be used prior to performing the stretching and strengthening activities prescribed by your caregiver, physical therapist, or athletic trainer. Use a heat pack or soak your injury in warm water. SEEK MEDICAL CARE IF:  Treatment seems to offer no benefit, or the condition worsens.  Any medications produce adverse side effects. EXERCISES RANGE OF MOTION (ROM) AND STRETCHING EXERCISES - Wrist Sprain  These exercises may help you when beginning to rehabilitate your injury. Your symptoms may resolve with or  without further involvement from your physician, physical therapist or athletic trainer. While completing these exercises, remember:   Restoring tissue flexibility helps normal motion to return to the joints. This allows healthier, less painful movement and activity.  An effective stretch should be held for at least 30 seconds.  A stretch should never be painful. You should only feel a gentle lengthening or release in the stretched tissue. RANGE OF MOTION - Wrist Flexion, Active-Assisted  Extend your right / left elbow with your fingers pointing down.*  Gently pull the back of your hand towards you until you feel a gentle stretch on the top of your forearm.  Hold this position for __________ seconds. Repeat __________ times. Complete this exercise __________ times per day.  *If directed by your physician, physical therapist or athletic trainer, complete this stretch with your elbow bent rather than extended. RANGE OF MOTION - Wrist Extension, Active-Assisted  Extend your right / left elbow and turn your palm upwards.*  Gently pull your palm/fingertips back so your wrist extends and your fingers point more toward the ground.  You should feel a gentle stretch on the inside of your forearm.  Hold this position for __________ seconds. Repeat __________ times. Complete this exercise __________ times per day. *If directed by your physician, physical therapist or athletic trainer, complete this stretch with your elbow bent, rather than extended. RANGE OF MOTION - Supination, Active  Stand or sit with your elbows at your side. Bend your right / left elbow to 90 degrees.  Turn your palm upward until you feel a gentle stretch on the inside of your forearm.  Hold this position for __________ seconds. Slowly release and return to the starting position. Repeat __________ times. Complete this stretch __________ times per day.  RANGE OF MOTION - Pronation, Active  Stand or sit with your elbows  at your side. Bend your right / left elbow to 90 degrees.  Turn your palm downward until you feel a gentle stretch on the top of your forearm.  Hold this position for __________ seconds. Slowly release and return to the starting position. Repeat __________ times. Complete this stretch __________ times per day.  STRETCH - Wrist Flexion  Place the back of your right / left hand on a tabletop leaving your elbow slightly bent. Your fingers should point away from your body.  Gently press the back of your hand down onto the table by straightening your elbow. You should feel a stretch on the top of your forearm.  Hold this position for __________ seconds. Repeat __________ times. Complete this stretch __________ times per day.  STRETCH - Wrist Extension  Place your right / left fingertips on a tabletop leaving your elbow slightly bent. Your fingers should point backwards.  Gently press your fingers and palm down onto the table by straightening your elbow. You should feel a stretch on the inside of your forearm.  Hold this position for __________ seconds. Repeat __________ times. Complete this stretch __________ times per day.  STRENGTHENING EXERCISES - Wrist Sprain These exercises may help you when beginning to rehabilitate your injury. They may resolve your symptoms with or without further involvement from your physician, physical therapist or athletic trainer. While completing these exercises, remember:   Muscles can gain both the endurance and the strength needed for everyday activities through controlled exercises.  Complete these exercises as instructed by your physician, physical therapist or athletic trainer. Progress with the resistance and repetition exercises only as your caregiver advises. STRENGTH - Wrist Flexors  Sit with your right / left forearm palm-up and fully supported. Your elbow should be resting below the height of your shoulder. Allow your wrist to extend over the edge  of the surface.  Loosely holding a __________ weight or a piece of rubber exercise band/tubing, slowly curl your hand up toward your forearm.  Hold this position for __________ seconds. Slowly lower the wrist back to the starting position in a controlled manner. Repeat __________ times. Complete this exercise __________ times per day.  STRENGTH - Wrist Extensors  Sit with your right / left forearm palm-down and fully supported. Your elbow should be resting below the height of your shoulder. Allow your wrist to extend over the edge of the surface.  Loosely holding a __________ weight or a piece of rubber exercise band/tubing, slowly curl your hand up toward your forearm.  Hold this position for __________ seconds. Slowly lower the wrist back to the starting position in a controlled manner. Repeat __________ times. Complete this exercise __________ times per day.  STRENGTH - Ulnar Deviators  Stand with a ____________________ weight in your right / left hand, or sit holding on to the rubber exercise band/tubing with your opposite arm supported.  Move your wrist so that your pinkie travels toward your forearm and your thumb moves away from your forearm.  Hold this position for __________ seconds and then slowly lower the wrist  back to the starting position. Repeat __________ times. Complete this exercise __________ times per day STRENGTH - Radial Deviators  Stand with a ____________________ weight in your  right / left hand, or sit holding on to the rubber exercise band/tubing with your arm supported.  Raise your hand upward in front of you or pull up on the rubber tubing.  Hold this position for __________ seconds and then slowly lower the wrist back to the starting position. Repeat __________ times. Complete this exercise __________ times per day. STRENGTH - Forearm Supinators  Sit with your right / left forearm supported on a table, keeping your elbow below shoulder height. Rest your  hand over the edge, palm down.  Gently grip a hammer or a soup ladle.  Without moving your elbow, slowly turn your palm and hand upward to a "thumbs-up" position.  Hold this position for __________ seconds. Slowly return to the starting position. Repeat __________ times. Complete this exercise __________ times per day.  STRENGTH - Forearm Pronators  Sit with your right / left forearm supported on a table, keeping your elbow below shoulder height. Rest your hand over the edge, palm up.  Gently grip a hammer or a soup ladle.  Without moving your elbow, slowly turn your palm and hand upward to a "thumbs-up" position.  Hold this position for __________ seconds. Slowly return to the starting position. Repeat __________ times. Complete this exercise __________ times per day.  STRENGTH - Grip  Grasp a tennis ball, a dense sponge, or a large, rolled sock in your hand.  Squeeze as hard as you can without increasing any pain.  Hold this position for __________ seconds. Release your grip slowly. Repeat __________ times. Complete this exercise __________ times per day.  Document Released: 11/02/2005 Document Revised: 01/25/2012 Document Reviewed: 02/14/2009 [facility] Patient Information 2015 [place], [place]. This information is not intended to replace advice given to you by your health care provider. Make sure you discuss any questions you have with your health care provider.

## 2014-10-22 ENCOUNTER — Encounter: Payer: Self-pay | Admitting: Cardiology

## 2014-10-25 ENCOUNTER — Encounter (HOSPITAL_COMMUNITY): Payer: Self-pay | Admitting: Cardiovascular Disease

## 2014-11-05 ENCOUNTER — Encounter: Payer: Self-pay | Admitting: Cardiology

## 2014-11-13 ENCOUNTER — Telehealth: Payer: Self-pay | Admitting: Neurology

## 2014-11-13 MED ORDER — DONEPEZIL HCL 10 MG PO TABS: 10.0000 mg | ORAL_TABLET | Freq: Every day | ORAL | Status: DC

## 2014-11-13 NOTE — Telephone Encounter (Signed)
Rx has been sent.  I called back.  Got no answer.  Left message.

## 2014-11-13 NOTE — Telephone Encounter (Signed)
Patient daughter called asking for a prescription to be sent to [facility] on [address] for Donepezil 10 mg she said he is completely out. She can be reached at [phone].

## 2014-11-19 ENCOUNTER — Telehealth: Payer: Self-pay | Admitting: *Deleted

## 2014-11-19 MED ORDER — MEMANTINE HCL 10 MG PO TABS: 10.0000 mg | ORAL_TABLET | Freq: Two times a day (BID) | ORAL | Status: DC

## 2014-11-19 NOTE — Telephone Encounter (Signed)
Patient's daughter Helene Kelp is requesting for patient's Namenda to be sent as a 26 day prescription if this is available. Helene Kelp can be reached at [phone], mail order number is [phone]

## 2014-11-19 NOTE — Telephone Encounter (Signed)
Thank you for taking the message Tashia.  Rx has been sent to [facility] for 90 day supply per patient request.  I called back.  They are aware.  Patient has appt next month.

## 2015-01-02 ENCOUNTER — Encounter: Payer: Self-pay | Admitting: Cardiovascular Disease

## 2015-01-02 ENCOUNTER — Ambulatory Visit (INDEPENDENT_AMBULATORY_CARE_PROVIDER_SITE_OTHER): Payer: Medicare Other | Admitting: *Deleted

## 2015-01-02 ENCOUNTER — Telehealth: Payer: Self-pay | Admitting: Cardiovascular Disease

## 2015-01-02 DIAGNOSIS — I442 Atrioventricular block, complete: Secondary | ICD-10-CM

## 2015-01-02 LAB — MDC_IDC_ENUM_SESS_TYPE_REMOTE
Battery Remaining Percentage: 91 %
Brady Statistic AP VS Percent: 1 %
Brady Statistic AS VS Percent: 1 %
Implantable Pulse Generator Serial Number: 7448063
Lead Channel Impedance Value: 540 Ohm
Lead Channel Pacing Threshold Amplitude: 0.75 V
Lead Channel Pacing Threshold Pulse Width: 0.4 ms
Lead Channel Sensing Intrinsic Amplitude: 6.5 mV
Lead Channel Setting Pacing Amplitude: 1.75 V
Lead Channel Setting Pacing Amplitude: 2 V
Lead Channel Setting Pacing Pulse Width: 0.4 ms
Lead Channel Setting Sensing Sensitivity: 2 mV
MDC IDC MSMT BATTERY REMAINING LONGEVITY: 99 mo
MDC IDC MSMT BATTERY VOLTAGE: 2.98 V
MDC IDC MSMT LEADCHNL RA IMPEDANCE VALUE: 480 Ohm
MDC IDC MSMT LEADCHNL RA PACING THRESHOLD PULSEWIDTH: 0.4 ms
MDC IDC MSMT LEADCHNL RA SENSING INTR AMPL: 4.8 mV
MDC IDC MSMT LEADCHNL RV PACING THRESHOLD AMPLITUDE: 1.5 V
MDC IDC SESS DTM: 20160217072725
MDC IDC STAT BRADY AP VP PERCENT: 99 %
MDC IDC STAT BRADY AS VP PERCENT: 1 %
MDC IDC STAT BRADY RA PERCENT PACED: 99 %
MDC IDC STAT BRADY RV PERCENT PACED: 99 %

## 2015-01-02 NOTE — Progress Notes (Signed)
Remote pacemaker transmission.   

## 2015-01-02 NOTE — Telephone Encounter (Signed)
Please call asap ,cocnerning his transmitting this morning.

## 2015-01-02 NOTE — Telephone Encounter (Signed)
Patient informed that transmission was received. 

## 2015-01-04 ENCOUNTER — Ambulatory Visit: Payer: Medicare Other | Admitting: Nurse Practitioner

## 2015-01-07 ENCOUNTER — Ambulatory Visit (INDEPENDENT_AMBULATORY_CARE_PROVIDER_SITE_OTHER): Payer: BLUE CROSS/BLUE SHIELD | Admitting: Nurse Practitioner

## 2015-01-07 ENCOUNTER — Telehealth: Payer: Self-pay | Admitting: Nurse Practitioner

## 2015-01-07 ENCOUNTER — Encounter: Payer: Self-pay | Admitting: Nurse Practitioner

## 2015-01-07 ENCOUNTER — Ambulatory Visit: Payer: Medicare Other | Admitting: Nurse Practitioner

## 2015-01-07 VITALS — BP 131/69 | HR 69 | Temp 97.3°F | Ht 67.5 in | Wt 186.0 lb

## 2015-01-07 DIAGNOSIS — G309 Alzheimer's disease, unspecified: Secondary | ICD-10-CM

## 2015-01-07 DIAGNOSIS — F0280 Dementia in other diseases classified elsewhere without behavioral disturbance: Secondary | ICD-10-CM

## 2015-01-07 MED ORDER — DONEPEZIL HCL 10 MG PO TABS: 10.0000 mg | ORAL_TABLET | Freq: Every day | ORAL | Status: DC

## 2015-01-07 MED ORDER — MEMANTINE HCL 10 MG PO TABS: 10.0000 mg | ORAL_TABLET | Freq: Two times a day (BID) | ORAL | Status: DC

## 2015-01-07 NOTE — Telephone Encounter (Signed)
Pt's wife is calling back to state that the  Pt gets memantine (NAMENDA) 10 MG tablet  from [facility] and also donepezil (ARICEPT) 10 MG tablet. She states that you would know what to do with this information.  Please call if you have any questions.

## 2015-01-07 NOTE — Patient Instructions (Signed)
Continue Namenda at current dose. Please let me know what pharmacy please Continue Aricept at current dose.  Exercise by walking 6 times weekly at least 30 minutes  F/U 6 to 8 months

## 2015-01-07 NOTE — Progress Notes (Signed)
[facility]  PATIENT: Kenneth Clay DOB: 12-27-1931   REASON FOR VISIT: follow up for memory disorder HISTORY FROM:patient, wife, daughter    HISTORY OF PRESENT ILLNESS:Kenneth Clay is a 81 year old right-handed white male with a history of a slowly progressive memory disorder. He was last seen in this office by Dr. Jannifer Franklin 01/04/2014. At that time the patient was on Namenda XR 28 mg daily however his insurance coverage changed and that medication was going to cost over $300 per month. He switched to Namenda 10 twice daily. He remains on Aricept daily. He gets no regular exercise. Appetite is reportedly good and he is sleeping well at that at night. There is no wandering behavior. The patient has not had any significant changes in his functional ability since last seen. He reports a good energy level during the day, he does not have any hobbies. The patient has not had any new medical issues that have come up since last seen. The patient returns for an evaluation.  REVIEW OF SYSTEMS: Full 14 system review of systems performed and notable only for those listed, all others are neg:  Constitutional: neg  Cardiovascular: neg Ear/Nose/Throat: Hearing loss Skin: neg Eyes: neg Respiratory: neg Gastroitestinal: neg  Hematology/Lymphatic: Easy bruising Endocrine: neg Musculoskeletal:neg Allergy/Immunology: neg Neurological: Memory loss,  decreased concentration Psychiatric: neg Sleep : neg   ALLERGIES: No Known Allergies  HOME MEDICATIONS: Outpatient Prescriptions Prior to Visit  Medication Sig Dispense Refill  . aspirin EC 81 MG tablet Take 81 mg by mouth daily.    . Omega-3 Fatty Acids (FISH OIL TRIPLE STRENGTH) 1400 MG CAPS Take 1,400 mg by mouth daily.    . benazepril (LOTENSIN) 20 MG tablet Take 20 mg by mouth daily.      . citalopram (CELEXA) 10 MG tablet Take 0.5 tablets (5 mg total) by mouth daily. 15 tablet 1  . donepezil (ARICEPT) 10 MG tablet Take 1  tablet (10 mg total) by mouth daily. 90 tablet 0  . folic acid (FOLVITE) 160 MCG tablet Take 400 mcg by mouth daily.    Marland Kitchen gemfibrozil (LOPID) 600 MG tablet Take 300 mg by mouth 2 (two) times daily before a meal.     . Multiple Vitamin (MULTIVITAMIN WITH MINERALS) TABS Take 1 tablet by mouth daily.    . pantoprazole (PROTONIX) 40 MG tablet Take 40 mg by mouth daily.     . memantine (NAMENDA) 10 MG tablet Take 1 tablet (10 mg total) by mouth 2 (two) times daily. 180 tablet 0   No facility-administered medications prior to visit.    PAST MEDICAL HISTORY: Past Medical History  Diagnosis Date  . Pacemaker     new gen. last check with insert 01/03/13  . Dementia   . Ulcer   . Hypertension   . Sinus node dysfunction   . Hyperlipidemia   . LV dysfunction     last echo-04/30/10, EF 50-55%, impaired LV relaxation  . H/O cardiovascular stress test 04/30/2010    Persantine myoview-normal  . H/O: GI bleed   . Depression     PAST SURGICAL HISTORY: Past Surgical History  Procedure Laterality Date  . Pacemaker insertion  1998  . Pacemaker generator change  2005    [place] DR  . Pacemaker generator change  01/03/2013    [facility] RF  . Rotator cuff repair      bilateral  . US echocardiography  04/30/10    aortic root sclerotic,RV mildly  dilated  . Pacemaker generator change N/A 01/03/2013    Procedure: PACEMAKER GENERATOR CHANGE;  Surgeon: Sanda Klein, MD;  Location: [facility] CATH LAB;  Service: Cardiovascular;  Laterality: N/A;    FAMILY HISTORY: Family History  Problem Relation Age of Onset  . Diabetes Mother   . Heart disease Father   . Diabetes Brother     SOCIAL HISTORY: History   Social History  . Marital Status: Married    Spouse Name: [name]  . Number of Children: 3  . Years of Education: N/A   Occupational History  . fire fighter-retired    Social History Main Topics  . Smoking status: Former Smoker    Quit date: 11/16/2004  . Smokeless tobacco:  Never Used  . Alcohol Use: No  . Drug Use: No  . Sexual Activity: Not on file   Other Topics Concern  . Not on file   Social History Narrative     PHYSICAL EXAM  Filed Vitals:   01/07/15 0834  BP: 131/69  Pulse: 69  Temp: 97.3 F (36.3 C)  TempSrc: Oral  Height: 5' 7.5" (1.715 m)  Weight: 186 lb (84.369 kg)   Body mass index is 28.68 kg/(m^2). General: The patient is alert and cooperative at the time of the examination. Skin: No significant peripheral edema is noted.   Neurologic Exam  Mental status: The Mini-Mental status examination done today shows a total score of 17/30. Last 22/30. He misses items in orientation calculation 3 of 3 recall and unable to copy a figure.AFT 6. Clock drawing 1/4. GDS=3 not depressed.  Cranial nerves: Facial symmetry is present. Speech is normal, no aphasia or dysarthria is noted. Extraocular movements are full. Visual fields are full. Motor: The patient has good strength in all 4 extremities. No focal weakness Sensory examination: Soft touch sensation is symmetric on the face, arms, and legs Coordination: The patient has good finger-nose-finger and heel-to-shin bilaterally. Gait and station: The patient has a normal gait. Tandem gait is normal. Romberg is negative. No drift is seen. Reflexes: Deep tendon reflexes are symmetric.   DIAGNOSTIC DATA (LABS, IMAGING, TESTING)  ASSESSMENT AND PLAN  81 y.o. year old male  has a past medical history of Pacemaker; Dementia;  Hypertension; Sinus node dysfunction; Hyperlipidemia; LV dysfunction;  Depression. here to follow-up for his memory loss. The Mini-Mental status examination done today shows a total score of 17/30. Last 22/30. He misses items in orientation calculation, and  3 of 3 recall and unable to copy a figure.AFT 6. Clock drawing 1/4. GDS=3 not depressed. The patient is a current patient of Dr. Jannifer Franklin  who is out of the office today . This note is sent to the work in doctor.     Continue  Namenda at current dose. Please let me know what pharmacy please Continue Aricept at current dose.  Exercise by walking 6 times weekly at least 30 minutes  Discussed long range planning and the hope is patient can stay at home as long as possible. Discussed skilled versus memory units. Discussed in home care. Greater than 50%of visit spent on long term options, fortunately no behavior issues at present.   F/U 6 to 8 months. Vst time 26 min Dennie Bible, [facility], [facility], APRN  [facility] [address], [place], [place] [address] [phone]

## 2015-01-07 NOTE — Progress Notes (Signed)
I reviewed note and agree with plan.   Penni Bombard, MD 3/84/6659, 9:35 PM Certified in Neurology, Neurophysiology and Neuroimaging  [facility] [address], [place], [place] [address] [phone]

## 2015-01-07 NOTE — Telephone Encounter (Signed)
Noted and RX sent.

## 2015-01-11 ENCOUNTER — Telehealth: Payer: Self-pay | Admitting: Nurse Practitioner

## 2015-01-11 NOTE — Telephone Encounter (Signed)
Patient 's daughter, Olegario Shearer questioning patient's status for driving?  Please call and advise.

## 2015-01-11 NOTE — Telephone Encounter (Signed)
TC to daughter. Made her aware the DMV can do a road test. I do not think he is safe to drive.She thanked me for the call

## 2015-01-11 NOTE — Telephone Encounter (Signed)
Spoke to [employer].  Advised to speak with PCP about starting unsafe to drive paperwork and if anything needed from neurologist ok to send form . She agreed. She wanted NP-CM to know she is concerned about her father driving.

## 2015-01-15 ENCOUNTER — Encounter: Payer: Self-pay | Admitting: Cardiology

## 2015-04-03 ENCOUNTER — Ambulatory Visit (INDEPENDENT_AMBULATORY_CARE_PROVIDER_SITE_OTHER): Payer: Medicare Other | Admitting: *Deleted

## 2015-04-03 ENCOUNTER — Telehealth: Payer: Self-pay | Admitting: Cardiovascular Disease

## 2015-04-03 DIAGNOSIS — I442 Atrioventricular block, complete: Secondary | ICD-10-CM

## 2015-04-03 NOTE — Telephone Encounter (Signed)
Pt wants to know if you received his transmission from this morning,Please call asap

## 2015-04-03 NOTE — Telephone Encounter (Signed)
Pt's daughter called back and said mother had talked to somebody.

## 2015-04-03 NOTE — Telephone Encounter (Signed)
Transmission was received. Wife voiced understanding.

## 2015-04-03 NOTE — Progress Notes (Signed)
Remote pacemaker transmission.   

## 2015-04-10 LAB — CUP PACEART REMOTE DEVICE CHECK
Battery Remaining Percentage: 82 %
Battery Voltage: 2.96 V
Brady Statistic AP VP Percent: 99 %
Brady Statistic AP VS Percent: 1 %
Brady Statistic AS VP Percent: 1 %
Brady Statistic RA Percent Paced: 99 %
Brady Statistic RV Percent Paced: 99 %
Lead Channel Impedance Value: 480 Ohm
Lead Channel Impedance Value: 540 Ohm
Lead Channel Pacing Threshold Pulse Width: 0.4 ms
Lead Channel Pacing Threshold Pulse Width: 0.4 ms
Lead Channel Sensing Intrinsic Amplitude: 5 mV
Lead Channel Setting Pacing Amplitude: 1.75 V
Lead Channel Setting Pacing Amplitude: 2 V
Lead Channel Setting Pacing Pulse Width: 0.4 ms
MDC IDC MSMT BATTERY REMAINING LONGEVITY: 88 mo
MDC IDC MSMT LEADCHNL RA PACING THRESHOLD AMPLITUDE: 0.75 V
MDC IDC MSMT LEADCHNL RV PACING THRESHOLD AMPLITUDE: 1.5 V
MDC IDC MSMT LEADCHNL RV SENSING INTR AMPL: 12 mV
MDC IDC PG SERIAL: 7448063
MDC IDC SESS DTM: 20160518075737
MDC IDC SET LEADCHNL RV SENSING SENSITIVITY: 2 mV
MDC IDC STAT BRADY AS VS PERCENT: 1 %
Pulse Gen Model: 2210

## 2015-04-19 ENCOUNTER — Encounter: Payer: Self-pay | Admitting: Cardiology

## 2015-04-26 ENCOUNTER — Encounter: Payer: Self-pay | Admitting: Cardiovascular Disease

## 2015-05-13 ENCOUNTER — Other Ambulatory Visit: Payer: Self-pay

## 2015-06-25 ENCOUNTER — Encounter: Payer: Medicare Other | Admitting: Cardiovascular Disease

## 2015-06-25 ENCOUNTER — Ambulatory Visit (INDEPENDENT_AMBULATORY_CARE_PROVIDER_SITE_OTHER): Payer: Medicare Other | Admitting: *Deleted

## 2015-06-25 DIAGNOSIS — I442 Atrioventricular block, complete: Secondary | ICD-10-CM

## 2015-06-25 NOTE — Progress Notes (Signed)
Remote pacemaker transmission.   

## 2015-07-02 LAB — CUP PACEART REMOTE DEVICE CHECK
Battery Remaining Longevity: 109 mo
Battery Remaining Percentage: 95.5 %
Battery Voltage: 2.96 V
Brady Statistic AP VP Percent: 99 %
Brady Statistic AP VS Percent: 1 %
Brady Statistic RV Percent Paced: 99 %
Date Time Interrogation Session: 20160808122855
Lead Channel Impedance Value: 510 Ohm
Lead Channel Pacing Threshold Amplitude: 0.75 V
Lead Channel Pacing Threshold Amplitude: 1.375 V
Lead Channel Pacing Threshold Pulse Width: 0.4 ms
Lead Channel Sensing Intrinsic Amplitude: 5 mV
Lead Channel Setting Pacing Amplitude: 2 V
Lead Channel Setting Pacing Pulse Width: 0.4 ms
MDC IDC MSMT LEADCHNL RV IMPEDANCE VALUE: 580 Ohm
MDC IDC MSMT LEADCHNL RV PACING THRESHOLD PULSEWIDTH: 0.4 ms
MDC IDC MSMT LEADCHNL RV SENSING INTR AMPL: 12 mV
MDC IDC SET LEADCHNL RV PACING AMPLITUDE: 1.625
MDC IDC SET LEADCHNL RV SENSING SENSITIVITY: 2 mV
MDC IDC STAT BRADY AS VP PERCENT: 1 %
MDC IDC STAT BRADY AS VS PERCENT: 1 %
MDC IDC STAT BRADY RA PERCENT PACED: 99 %
Pulse Gen Model: 2210
Pulse Gen Serial Number: 7448063

## 2015-07-08 ENCOUNTER — Ambulatory Visit: Payer: BLUE CROSS/BLUE SHIELD | Admitting: Nurse Practitioner

## 2015-07-15 ENCOUNTER — Encounter: Payer: Self-pay | Admitting: Nurse Practitioner

## 2015-07-15 ENCOUNTER — Ambulatory Visit (INDEPENDENT_AMBULATORY_CARE_PROVIDER_SITE_OTHER): Payer: Medicare Other | Admitting: Nurse Practitioner

## 2015-07-15 VITALS — BP 120/58 | HR 80 | Ht 66.0 in | Wt 180.0 lb

## 2015-07-15 DIAGNOSIS — G309 Alzheimer's disease, unspecified: Secondary | ICD-10-CM

## 2015-07-15 DIAGNOSIS — F0280 Dementia in other diseases classified elsewhere without behavioral disturbance: Secondary | ICD-10-CM

## 2015-07-15 DIAGNOSIS — F0390 Unspecified dementia without behavioral disturbance: Secondary | ICD-10-CM | POA: Diagnosis not present

## 2015-07-15 MED ORDER — DONEPEZIL HCL 10 MG PO TABS: 10.0000 mg | ORAL_TABLET | Freq: Every day | ORAL | Status: DC

## 2015-07-15 MED ORDER — MEMANTINE HCL 10 MG PO TABS: 10.0000 mg | ORAL_TABLET | Freq: Two times a day (BID) | ORAL | Status: DC

## 2015-07-15 NOTE — Progress Notes (Signed)
I have read the note, and I agree with the clinical assessment and plan.  WILLIS,CHARLES KEITH   

## 2015-07-15 NOTE — Progress Notes (Signed)
[facility]  PATIENT: Kenneth Clay DOB: 1932-09-05   REASON FOR VISIT: Follow-up for Alzheimer's dementia HISTORY FROM: Wife and daughter    HISTORY OF PRESENT ILLNESS:Kenneth Clay is a 81year-old right-handed white male with a history of a slowly progressive memory disorder. He was last seen in this office 01/07/15.   He is on  Namenda 10 twice daily. His copayment for Namenda XR was $300.  He remains on Aricept daily. He gets no regular exercise. Appetite is reportedly good and he is sleeping well at night. There is no wandering behavior. The patient has not had any significant changes in his functional ability since last seen. He reports a good energy level during the day, he does not have any hobbies. The patient has not had any new medical issues that have come up since last seen. The patient returns for an evaluation.    REVIEW OF SYSTEMS: Full 14 system review of systems performed and notable only for those listed, all others are neg:  Constitutional: neg  Cardiovascular: neg Ear/Nose/Throat: neg  Skin: neg Eyes: neg Respiratory: neg Gastroitestinal: neg  Hematology/Lymphatic: neg  Endocrine: neg Musculoskeletal:neg Allergy/Immunology: neg Neurological: Memory loss Psychiatric: neg Sleep : neg   ALLERGIES: No Known Allergies  HOME MEDICATIONS: Outpatient Prescriptions Prior to Visit  Medication Sig Dispense Refill  . aspirin EC 81 MG tablet Take 81 mg by mouth daily.    . benazepril (LOTENSIN) 20 MG tablet Take 20 mg by mouth daily.      . citalopram (CELEXA) 10 MG tablet Take 0.5 tablets (5 mg total) by mouth daily. 15 tablet 1  . donepezil (ARICEPT) 10 MG tablet Take 1 tablet (10 mg total) by mouth daily. 90 tablet 1  . gemfibrozil (LOPID) 600 MG tablet Take 300 mg by mouth 2 (two) times daily before a meal.     . memantine (NAMENDA) 10 MG tablet Take 1 tablet (10 mg total) by mouth 2 (two) times daily. 180 tablet 1  . Omega-3 Fatty Acids  (FISH OIL TRIPLE STRENGTH) 1400 MG CAPS Take 1,400 mg by mouth daily.    . pantoprazole (PROTONIX) 40 MG tablet Take 40 mg by mouth daily.      No facility-administered medications prior to visit.    PAST MEDICAL HISTORY: Past Medical History  Diagnosis Date  . Pacemaker     new gen. last check with insert 01/03/13  . Dementia   . Ulcer   . Hypertension   . Sinus node dysfunction   . Hyperlipidemia   . LV dysfunction     last echo-04/30/10, EF 50-55%, impaired LV relaxation  . H/O cardiovascular stress test 04/30/2010    Persantine myoview-normal  . H/O: GI bleed   . Depression     PAST SURGICAL HISTORY: Past Surgical History  Procedure Laterality Date  . Pacemaker insertion  1998  . Pacemaker generator change  2005    [place] DR  . Pacemaker generator change  01/03/2013    ST. Jude Accent DR RF  . Rotator cuff repair      bilateral  . US echocardiography  04/30/10    aortic root sclerotic,RV mildly dilated  . Pacemaker generator change N/A 01/03/2013    Procedure: PACEMAKER GENERATOR CHANGE;  Surgeon: Sanda Klein, MD;  Location: [facility];  Service: Cardiovascular;  Laterality: N/A;  . Sciatica      injection 06-2015    FAMILY HISTORY: Family History  Problem Relation Age of Onset  .  Diabetes Mother   . Heart disease Father   . Diabetes Brother     SOCIAL HISTORY: Social History   Social History  . Marital Status: Married    Spouse Name: [name]  . Number of Children: 3  . Years of Education: N/A   Occupational History  . fire fighter-retired    Social History Main Topics  . Smoking status: Former Smoker    Quit date: 11/16/2004  . Smokeless tobacco: Never Used  . Alcohol Use: No  . Drug Use: No  . Sexual Activity: Not on file   Other Topics Concern  . Not on file   Social History Narrative     PHYSICAL EXAM  Filed Vitals:   07/15/15 1258  BP: 120/58  Pulse: 80  Height: 5\' 6"  (1.676 m)  Weight: 180 lb (81.647 kg)   Body  mass index is 29.07 kg/(m^2). General: The patient is alert and cooperative at the time of the examination. Skin: No significant peripheral edema is noted.   Neurologic Exam  Mental status: The Mini-Mental status examination done today shows a total score of 18/30. Last 17/30. He misses items in orientation calculation 3 of 3 recall and unable to copy a figure.AFT 7. Clock drawing 2/4.   Cranial nerves: Facial symmetry is present. Speech is normal, no aphasia or dysarthria is noted. Extraocular movements are full. Visual fields are full. Motor: The patient has good strength in all 4 extremities. No focal weakness Sensory examination: Soft touch sensation is symmetric on the face, arms, and legs Coordination: The patient has good finger-nose-finger and heel-to-shin bilaterally. Gait and station: The patient has a normal gait. Tandem gait is normal. Romberg is negative. No drift is seen. Reflexes: Deep tendon reflexes are symmetric.  DIAGNOSTIC DATA (LABS, IMAGING, TESTING) -  ASSESSMENT AND PLAN 81 y.o. year old male has a past medical history of Pacemaker; Dementia; Hypertension; Sinus node dysfunction; Hyperlipidemia; LV dysfunction; Depression. here to follow-up for his memory loss. The Mini-Mental status examination done today shows a total score of 18/30. Last 17/30. He misses items in orientation calculation, and 3 of 3 recall and unable to copy a figure.AFT 7. Clock drawing 2/4  Continue Namenda at current dose Continue Aricept at current dose F/U in 6 months Dennie Bible, [facility], [facility], APRN  [facility] [address] [place], [place] [address] [phone]

## 2015-07-15 NOTE — Patient Instructions (Signed)
Continue Namenda at current dose Continue Aricept at current dose F/U in 6 months 

## 2015-07-17 ENCOUNTER — Encounter: Payer: Self-pay | Admitting: Cardiology

## 2015-07-23 ENCOUNTER — Encounter: Payer: Self-pay | Admitting: Cardiovascular Disease

## 2015-07-26 ENCOUNTER — Other Ambulatory Visit: Payer: Self-pay | Admitting: Neurology

## 2015-07-31 ENCOUNTER — Encounter: Payer: Self-pay | Admitting: Cardiology

## 2015-09-03 ENCOUNTER — Encounter: Payer: Self-pay | Admitting: Cardiovascular Disease

## 2015-09-03 ENCOUNTER — Ambulatory Visit (INDEPENDENT_AMBULATORY_CARE_PROVIDER_SITE_OTHER): Payer: Medicare Other | Admitting: Cardiovascular Disease

## 2015-09-03 VITALS — BP 130/74 | HR 76 | Ht 66.0 in | Wt 181.0 lb

## 2015-09-03 DIAGNOSIS — I495 Sick sinus syndrome: Secondary | ICD-10-CM

## 2015-09-03 DIAGNOSIS — I442 Atrioventricular block, complete: Secondary | ICD-10-CM | POA: Diagnosis not present

## 2015-09-03 DIAGNOSIS — Z950 Presence of cardiac pacemaker: Secondary | ICD-10-CM | POA: Diagnosis not present

## 2015-09-03 DIAGNOSIS — I498 Other specified cardiac arrhythmias: Secondary | ICD-10-CM | POA: Diagnosis not present

## 2015-09-03 DIAGNOSIS — I10 Essential (primary) hypertension: Secondary | ICD-10-CM

## 2015-09-03 HISTORY — DX: Atrioventricular block, complete: I44.2

## 2015-09-03 NOTE — Patient Instructions (Signed)
Your physician wants you to follow-up in: 1 Year. You will receive a reminder letter in the mail two months in advance. If you don't receive a letter, please call our office to schedule the follow-up appointment.  Remote monitoring is used to monitor your Pacemaker of ICD from home. This monitoring reduces the number of office visits required to check your device to one time per year. It allows us to keep an eye on the functioning of your device to ensure it is working properly. You are scheduled for a device check from home on 12/03/2015. You may send your transmission at any time that day. If you have a wireless device, the transmission will be sent automatically. After your physician reviews your transmission, you will receive a postcard with your next transmission date.  If you need a refill on your cardiac medications before your next appointment, please call your pharmacy.    

## 2015-09-03 NOTE — Progress Notes (Signed)
Patient ID: Kenneth Clay, male   DOB: 05-23-1932, 81 y.o.   MRN: [id number]     Cardiology Office Note   Date:  09/03/2015   ID:  Kenneth Clay, DOB 08-09-1932, MRN [id number]  PCP:  Mathews Argyle, MD  Cardiologist:  Sanda Klein, MD   Chief Complaint  Patient presents with  . Annual Exam    Patient has no complaints.      History of Present Illness: Kenneth Clay is a 81 y.o. male who presents for pacemaker follow up.  He feels great. No new health problems. May need surgery for skin mass right neck.  He first received a permanent pacemaker in 1998 with subsequent generator changes in 2005 and 2014. He seems to have both sinus node arrest and complete heart block and is pacemaker dependent. His current device is a Buyer, retail RF dual chamber pacemaker with the original lead still in use.  He does not appear to have structural heart disease. He has normal left ventricular systolic function and a low-risk myocardial perfusion scan June 2011. Has mild and well controlled systemic hypertension, hypertriglyceridemia and takes both Aricept and Namenda for worsening dementia.    Past Medical History  Diagnosis Date  . Pacemaker     new gen. last check with insert 01/03/13  . Dementia   . Ulcer   . Hypertension   . Sinus node dysfunction (HCC)   . Hyperlipidemia   . LV dysfunction     last echo-04/30/10, EF 50-55%, impaired LV relaxation  . H/O cardiovascular stress test 04/30/2010    Persantine myoview-normal  . H/O: GI bleed   . Depression   . CHB (complete heart block) ([place]) 09/03/2015    Past Surgical History  Procedure Laterality Date  . Pacemaker insertion  1998  . Pacemaker generator change  2005    [place] DR  . Pacemaker generator change  01/03/2013    ST. Jude Accent DR RF  . Rotator cuff repair      bilateral  . US echocardiography  04/30/10    aortic root sclerotic,RV mildly dilated  . Pacemaker generator change N/A 01/03/2013      Procedure: PACEMAKER GENERATOR CHANGE;  Surgeon: Sanda Klein, MD;  Location: [facility];  Service: Cardiovascular;  Laterality: N/A;  . Sciatica      injection 06-2015     Current Outpatient Prescriptions  Medication Sig Dispense Refill  . aspirin EC 81 MG tablet Take 81 mg by mouth daily.    . benazepril (LOTENSIN) 20 MG tablet Take 20 mg by mouth daily.      . citalopram (CELEXA) 10 MG tablet Take 0.5 tablets (5 mg total) by mouth daily. 15 tablet 1  . Coconut Oil 1000 MG CAPS Take by mouth daily.    Marland Kitchen donepezil (ARICEPT) 10 MG tablet Take 1 tablet (10 mg total) by mouth daily. 90 tablet 1  . donepezil (ARICEPT) 10 MG tablet TAKE 1 BY MOUTH DAILY --N C MARTIN 90 tablet 1  . gemfibrozil (LOPID) 600 MG tablet Take 300 mg by mouth 2 (two) times daily before a meal.     . memantine (NAMENDA) 10 MG tablet Take 1 tablet (10 mg total) by mouth 2 (two) times daily. 180 tablet 1  . memantine (NAMENDA) 10 MG tablet TAKE 1 BY MOUTH TWICE DAILY --N C MARTIN 180 tablet 1  . Omega-3 Fatty Acids (FISH OIL TRIPLE STRENGTH) 1400 MG CAPS Take 1,400 mg by mouth daily.    Marland Kitchen  pantoprazole (PROTONIX) 40 MG tablet Take 40 mg by mouth daily.      No current facility-administered medications for this visit.    Allergies:   Review of patient's allergies indicates no known allergies.    Social History:  The patient  reports that he quit smoking about 10 years ago. He has never used smokeless tobacco. He reports that he does not drink alcohol or use illicit drugs.   Family History:  The patient's family history includes Diabetes in his brother and mother; Heart disease in his father.    ROS:  Please see the history of present illness.    Otherwise, review of systems positive for none.   All other systems are reviewed and negative.    PHYSICAL EXAM: VS:  BP 130/74 mmHg  Pulse 76  Ht 5\' 6"  (1.676 m)  Wt 181 lb (82.101 kg)  BMI 29.23 kg/m2 , BMI Body mass index is 29.23 kg/(m^2).  General: Alert,  oriented x3, no distress Head: no evidence of trauma, PERRL, EOMI, no exophtalmos or lid lag, no myxedema, no xanthelasma; normal ears, nose and oropharynx Neck: normal jugular venous pulsations and no hepatojugular reflux; brisk carotid pulses without delay and no carotid bruits Chest: clear to auscultation, no signs of consolidation by percussion or palpation, normal fremitus, symmetrical and full respiratory excursions Cardiovascular: normal position and quality of the apical impulse, regular rhythm, normal first and second heart sounds, early peaking 1/6 systolic aortic ejection murmur, rubs or gallops Abdomen: no tenderness or distention, no masses by palpation, no abnormal pulsatility or arterial bruits, normal bowel sounds, no hepatosplenomegaly Extremities: no clubbing, cyanosis or edema; 2+ radial, ulnar and brachial pulses bilaterally; 2+ right femoral, posterior tibial and dorsalis pedis pulses; 2+ left femoral, posterior tibial and dorsalis pedis pulses; no subclavian or femoral bruits Neurological: grossly nonfocal Psych: euthymic mood, full affect   EKG:  EKG is ordered today. The ekg ordered today demonstrates 100% AV sequential pacing   Lipid Panel July 2015 TC 168, TG 122, HDL 39, LDL 105 August 2016 TC178, TG 167, HDL 36, LDL108  Wt Readings from Last 3 Encounters:  09/03/15 181 lb (82.101 kg)  07/15/15 180 lb (81.647 kg)  01/07/15 186 lb (84.369 kg)      ASSESSMENT AND PLAN:  Pacemaker for sinus node dysfunction-'98, '05- Gen change 01/03/13 (St Jude) Complete heart block Normally functioning device. He is pacemaker dependent. Continue remote monitoring every 3 months and office appointments yearly. If he needs surgery with the use of electrocautery, needs appropriate device reprogramming (magnet on device if urgent) since he is device dependent.  Dyslipidemia Minimally abnormal TG. Would recommend stoping gemfibrozil  HTN (hypertension) Good control on ACEi  monotherapy.    Current medicines are reviewed at length with the patient today.  The patient does not have concerns regarding medicines.  The following changes have been made:  Recommend DC gemfibrozil  Labs/ tests ordered today include:   Orders Placed This Encounter  Procedures  . EKG 12-Lead     Patient Instructions  Your physician wants you to follow-up in: 1 Year. You will receive a reminder letter in the mail two months in advance. If you don't receive a letter, please call our office to schedule the follow-up appointment.  Remote monitoring is used to monitor your Pacemaker of ICD from home. This monitoring reduces the number of office visits required to check your device to one time per year. It allows [place] to keep an eye on the functioning  of your device to ensure it is working properly. You are scheduled for a device check from home on 12/03/2015. You may send your transmission at any time that day. If you have a wireless device, the transmission will be sent automatically. After your physician reviews your transmission, you will receive a postcard with your next transmission date.  If you need a refill on your cardiac medications before your next appointment, please call your pharmacy.          Mikael Spray, MD  09/03/2015 8:56 AM    Sanda Klein, MD, [facility] [phone] office [phone] pager

## 2015-09-10 ENCOUNTER — Encounter: Payer: Self-pay | Admitting: Cardiovascular Disease

## 2015-09-13 ENCOUNTER — Other Ambulatory Visit: Payer: Self-pay | Admitting: Nurse Practitioner

## 2015-09-13 LAB — CUP PACEART INCLINIC DEVICE CHECK
Battery Remaining Longevity: 102
Battery Voltage: 2.96 V
Brady Statistic RV Percent Paced: 99.96 %
Date Time Interrogation Session: 20161018123516
Implantable Lead Implant Date: 19980305
Implantable Lead Implant Date: 19980305
Implantable Lead Location: 753859
Implantable Lead Location: 753860
Lead Channel Impedance Value: 462.5 Ohm
Lead Channel Impedance Value: 775 Ohm
Lead Channel Pacing Threshold Amplitude: 0.75 V
Lead Channel Pacing Threshold Amplitude: 0.75 V
Lead Channel Pacing Threshold Pulse Width: 0.4 ms
Lead Channel Sensing Intrinsic Amplitude: 4.8 mV
Lead Channel Setting Pacing Amplitude: 1.25 V
Lead Channel Setting Pacing Amplitude: 2 V
Lead Channel Setting Pacing Pulse Width: 0.4 ms
Lead Channel Setting Sensing Sensitivity: 2 mV
MDC IDC MSMT LEADCHNL RA PACING THRESHOLD PULSEWIDTH: 0.4 ms
MDC IDC MSMT LEADCHNL RV PACING THRESHOLD AMPLITUDE: 1 V
MDC IDC MSMT LEADCHNL RV PACING THRESHOLD PULSEWIDTH: 0.4 ms
MDC IDC MSMT LEADCHNL RV SENSING INTR AMPL: 12 mV
MDC IDC STAT BRADY RA PERCENT PACED: 99.48 %
Pulse Gen Model: 2210
Pulse Gen Serial Number: 7448063

## 2015-11-19 ENCOUNTER — Telehealth: Payer: Self-pay | Admitting: Cardiovascular Disease

## 2015-11-19 NOTE — Telephone Encounter (Signed)
[name] is calling because Kenneth Clay has a growth that is still growing on his neck line . Its getting bigger and harder . Please call    Thanks

## 2015-11-19 NOTE — Telephone Encounter (Signed)
Spoke with pt, he has a mass on his neck that has started to be painful. He would like to have it taken off. Explained he would need to talk to his medical doctor about a referral to see a surgeon or dermatologist. He will give dr Felipa Eth a call.

## 2015-11-20 ENCOUNTER — Telehealth: Payer: Self-pay | Admitting: Cardiovascular Disease

## 2015-11-20 NOTE — Telephone Encounter (Signed)
Called, cell phone/daughter contact is not correct - goes to wrong number. If pt or daughter calls back please have phone numbers verified.

## 2015-11-20 NOTE — Telephone Encounter (Signed)
Please call,concerning a procedure pt needs to have.

## 2015-11-21 NOTE — Telephone Encounter (Signed)
Kenneth Clay called back.  Pt has scheduled removal of sebaceous cyst by Dr. Lavonna Monarch (or his PA) on 1/19. Concern was apparently regarding instructions that pt should not have any cauterization done from stomach up as related to his PM.  Daughter wanted to see if we could provide letter or instructions for clarification of precautions - aware I will route to Dr. Sallyanne Kuster for advice.

## 2015-11-22 NOTE — Telephone Encounter (Signed)
Returned call to daughter and gave recommendations. She voiced understanding. Asked me to call dermatology office which I told her I was happy to do.  I contacted Dr. Onalee Hua office. Spoke to [place], RN who voiced understanding of Dr. Victorino December recommendations - she has made note in chart. Did indicate it likely that the patient would not need cautery and they could do removal of cyst w/o it. I requested them to call us if there are any additional concerns.  Called pt's daughter back and informed her of status of this, she voiced understanding.

## 2015-11-22 NOTE — Telephone Encounter (Signed)
HE IS PACEMAKER DEPENDENT. Use of cautery in the vicinity of the upper torso/neck can lead to inhibition of pacemaker function and asystole. Cautery should be avoided. If the procedure cannot be done with electrocautery we need to temporarily reprogram his device for the procedure. Please let us know if electrocautery is planned and we can arrange that with industry.

## 2015-12-03 ENCOUNTER — Ambulatory Visit (INDEPENDENT_AMBULATORY_CARE_PROVIDER_SITE_OTHER): Payer: Medicare Other | Admitting: *Deleted

## 2015-12-03 DIAGNOSIS — I495 Sick sinus syndrome: Secondary | ICD-10-CM | POA: Diagnosis not present

## 2015-12-03 NOTE — Progress Notes (Signed)
Remote pacemaker transmission.   

## 2015-12-09 LAB — CUP PACEART REMOTE DEVICE CHECK
Battery Remaining Longevity: 109 mo
Battery Remaining Percentage: 95.5 %
Battery Voltage: 2.96 V
Brady Statistic AP VS Percent: 1 %
Brady Statistic AS VS Percent: 1 %
Brady Statistic RV Percent Paced: 99 %
Date Time Interrogation Session: 20170117070009
Implantable Lead Implant Date: 19980305
Implantable Lead Location: 753859
Lead Channel Pacing Threshold Pulse Width: 0.4 ms
Lead Channel Sensing Intrinsic Amplitude: 5 mV
Lead Channel Setting Pacing Amplitude: 1.625
Lead Channel Setting Pacing Amplitude: 2 V
Lead Channel Setting Pacing Pulse Width: 0.4 ms
MDC IDC LEAD IMPLANT DT: 19980305
MDC IDC LEAD LOCATION: 753860
MDC IDC MSMT LEADCHNL RA IMPEDANCE VALUE: 460 Ohm
MDC IDC MSMT LEADCHNL RV IMPEDANCE VALUE: 700 Ohm
MDC IDC MSMT LEADCHNL RV PACING THRESHOLD AMPLITUDE: 1.375 V
MDC IDC SET LEADCHNL RV SENSING SENSITIVITY: 2 mV
MDC IDC STAT BRADY AP VP PERCENT: 99 %
MDC IDC STAT BRADY AS VP PERCENT: 1 %
MDC IDC STAT BRADY RA PERCENT PACED: 99 %
Pulse Gen Model: 2210
Pulse Gen Serial Number: 7448063

## 2015-12-13 ENCOUNTER — Encounter: Payer: Self-pay | Admitting: Cardiology

## 2015-12-27 ENCOUNTER — Encounter: Payer: Self-pay | Admitting: Cardiology

## 2016-01-09 ENCOUNTER — Other Ambulatory Visit: Payer: Self-pay | Admitting: Geriatric Medicine

## 2016-01-09 ENCOUNTER — Ambulatory Visit
Admission: RE | Admit: 2016-01-09 | Discharge: 2016-01-09 | Disposition: A | Discharge status: Home / Self Care | Payer: Medicare Other | Source: Ambulatory Visit | Attending: Geriatric Medicine | Admitting: Geriatric Medicine

## 2016-01-09 DIAGNOSIS — J189 Pneumonia, unspecified organism: Secondary | ICD-10-CM

## 2016-01-12 ENCOUNTER — Encounter (HOSPITAL_COMMUNITY): Payer: Self-pay | Admitting: Emergency Medicine

## 2016-01-12 ENCOUNTER — Observation Stay (HOSPITAL_COMMUNITY)
Admission: EM | Admit: 2016-01-12 | Discharge: 2016-01-13 | Disposition: A | Discharge status: Home / Self Care | Payer: Medicare Other | Attending: Internal Medicine | Admitting: Internal Medicine

## 2016-01-12 ENCOUNTER — Emergency Department (HOSPITAL_COMMUNITY): Payer: Medicare Other

## 2016-01-12 ENCOUNTER — Inpatient Hospital Stay (HOSPITAL_COMMUNITY): Payer: Medicare Other

## 2016-01-12 DIAGNOSIS — I214 Non-ST elevation (NSTEMI) myocardial infarction: Secondary | ICD-10-CM | POA: Diagnosis present

## 2016-01-12 DIAGNOSIS — I10 Essential (primary) hypertension: Secondary | ICD-10-CM | POA: Diagnosis present

## 2016-01-12 DIAGNOSIS — R7989 Other specified abnormal findings of blood chemistry: Secondary | ICD-10-CM | POA: Diagnosis not present

## 2016-01-12 DIAGNOSIS — K219 Gastro-esophageal reflux disease without esophagitis: Secondary | ICD-10-CM | POA: Diagnosis not present

## 2016-01-12 DIAGNOSIS — E785 Hyperlipidemia, unspecified: Secondary | ICD-10-CM | POA: Diagnosis not present

## 2016-01-12 DIAGNOSIS — J189 Pneumonia, unspecified organism: Secondary | ICD-10-CM | POA: Diagnosis present

## 2016-01-12 DIAGNOSIS — I442 Atrioventricular block, complete: Secondary | ICD-10-CM | POA: Diagnosis present

## 2016-01-12 DIAGNOSIS — Z950 Presence of cardiac pacemaker: Secondary | ICD-10-CM | POA: Diagnosis not present

## 2016-01-12 DIAGNOSIS — Z7982 Long term (current) use of aspirin: Secondary | ICD-10-CM | POA: Diagnosis not present

## 2016-01-12 DIAGNOSIS — Z79899 Other long term (current) drug therapy: Secondary | ICD-10-CM | POA: Insufficient documentation

## 2016-01-12 DIAGNOSIS — F329 Major depressive disorder, single episode, unspecified: Secondary | ICD-10-CM | POA: Insufficient documentation

## 2016-01-12 DIAGNOSIS — F0390 Unspecified dementia without behavioral disturbance: Secondary | ICD-10-CM | POA: Diagnosis present

## 2016-01-12 DIAGNOSIS — Z87891 Personal history of nicotine dependence: Secondary | ICD-10-CM | POA: Insufficient documentation

## 2016-01-12 DIAGNOSIS — R0609 Other forms of dyspnea: Secondary | ICD-10-CM | POA: Diagnosis present

## 2016-01-12 DIAGNOSIS — D72829 Elevated white blood cell count, unspecified: Secondary | ICD-10-CM | POA: Diagnosis not present

## 2016-01-12 DIAGNOSIS — A419 Sepsis, unspecified organism: Secondary | ICD-10-CM

## 2016-01-12 DIAGNOSIS — R05 Cough: Secondary | ICD-10-CM | POA: Diagnosis present

## 2016-01-12 DIAGNOSIS — J181 Lobar pneumonia, unspecified organism: Secondary | ICD-10-CM | POA: Diagnosis not present

## 2016-01-12 DIAGNOSIS — R778 Other specified abnormalities of plasma proteins: Secondary | ICD-10-CM | POA: Insufficient documentation

## 2016-01-12 LAB — COMPREHENSIVE METABOLIC PANEL
ALBUMIN: 3.5 g/dL (ref 3.5–5.0)
ALK PHOS: 105 U/L (ref 38–126)
ALT: 47 U/L (ref 17–63)
ANION GAP: 10 (ref 5–15)
AST: 58 U/L — ABNORMAL HIGH (ref 15–41)
BUN: 13 mg/dL (ref 6–20)
CALCIUM: 9.6 mg/dL (ref 8.9–10.3)
CHLORIDE: 103 mmol/L (ref 101–111)
CO2: 27 mmol/L (ref 22–32)
Creatinine, Ser: 0.94 mg/dL (ref 0.61–1.24)
GFR calc Af Amer: >60 mL/min (ref >60)
GFR calc non Af Amer: >60 mL/min (ref >60)
GLUCOSE: 117 mg/dL — AB (ref 65–99)
Potassium: 3.4 mmol/L — ABNORMAL LOW (ref 3.5–5.1)
Sodium: 140 mmol/L (ref 135–145)
Total Bilirubin: 1 mg/dL (ref 0.3–1.2)
Total Protein: 7.2 g/dL (ref 6.5–8.1)

## 2016-01-12 LAB — TROPONIN I
Troponin I: 0.56 ng/mL (ref <0.031)
Troponin I: 0.63 ng/mL (ref <0.031)

## 2016-01-12 LAB — CBC WITH DIFFERENTIAL/PLATELET
Basophils Absolute: 0 10*3/uL (ref 0.0–0.1)
Basophils Relative: 0 %
EOS ABS: 0.3 10*3/uL (ref 0.0–0.7)
Eosinophils Relative: 2 %
HCT: 42.4 % (ref 39.0–52.0)
HEMOGLOBIN: 14.7 g/dL (ref 13.0–17.0)
Lymphocytes Relative: 20 %
Lymphs Abs: 2.5 10*3/uL (ref 0.7–4.0)
MCH: 32.2 pg (ref 26.0–34.0)
MCHC: 34.7 g/dL (ref 30.0–36.0)
MCV: 92.8 fL (ref 78.0–100.0)
MONOS PCT: 12 %
Monocytes Absolute: 1.6 10*3/uL — ABNORMAL HIGH (ref 0.1–1.0)
NEUTROS PCT: 66 %
Neutro Abs: 8.4 10*3/uL — ABNORMAL HIGH (ref 1.7–7.7)
PLATELETS: 320 10*3/uL (ref 150–400)
RBC: 4.57 MIL/uL (ref 4.22–5.81)
RDW: 13.2 % (ref 11.5–15.5)
WBC: 12.9 10*3/uL — AB (ref 4.0–10.5)

## 2016-01-12 LAB — PROCALCITONIN: PROCALCITONIN: 0.62 ng/mL

## 2016-01-12 LAB — LACTIC ACID, PLASMA: LACTIC ACID, VENOUS: 2 mmol/L (ref 0.5–2.0)

## 2016-01-12 LAB — BRAIN NATRIURETIC PEPTIDE: B Natriuretic Peptide: 55.5 pg/mL (ref 0.0–100.0)

## 2016-01-12 LAB — PROTIME-INR
INR: 1.36 (ref 0.00–1.49)
PROTHROMBIN TIME: 16.4 s — AB (ref 11.6–15.2)

## 2016-01-12 LAB — APTT: APTT: 36 s (ref 24–37)

## 2016-01-12 MED ORDER — DM-GUAIFENESIN ER 30-600 MG PO TB12
1.0000 | ORAL_TABLET | Freq: Two times a day (BID) | ORAL | Status: DC
  Administered 2016-01-12 – 2016-01-13 (×2): 1 via ORAL
  Filled 2016-01-12 (×3): qty 1

## 2016-01-12 MED ORDER — OMEGA-3-ACID ETHYL ESTERS 1 G PO CAPS
1.0000 g | ORAL_CAPSULE | Freq: Every day | ORAL | Status: DC
  Administered 2016-01-13: 1 g via ORAL
  Filled 2016-01-12: qty 1

## 2016-01-12 MED ORDER — SODIUM CHLORIDE 0.9 % IV BOLUS (SEPSIS)
1000.0000 mL | Freq: Once | INTRAVENOUS | Status: AC
  Administered 2016-01-12: 1000 mL via INTRAVENOUS

## 2016-01-12 MED ORDER — CARVEDILOL 3.125 MG PO TABS
3.1250 mg | ORAL_TABLET | Freq: Two times a day (BID) | ORAL | Status: DC
Start: 2016-01-12 — End: 2016-01-12

## 2016-01-12 MED ORDER — SODIUM CHLORIDE 0.9 % IV SOLN
INTRAVENOUS | Status: DC
  Administered 2016-01-12 – 2016-01-13 (×2): via INTRAVENOUS

## 2016-01-12 MED ORDER — MORPHINE SULFATE (PF) 2 MG/ML IV SOLN: 2.0000 mg | INTRAVENOUS | Status: DC | PRN

## 2016-01-12 MED ORDER — ATORVASTATIN CALCIUM 40 MG PO TABS
40.0000 mg | ORAL_TABLET | Freq: Every day | ORAL | Status: DC
  Filled 2016-01-12: qty 1

## 2016-01-12 MED ORDER — LEVOFLOXACIN 500 MG PO TABS
500.0000 mg | ORAL_TABLET | Freq: Every day | ORAL | Status: DC
  Administered 2016-01-12 – 2016-01-13 (×2): 500 mg via ORAL
  Filled 2016-01-12 (×2): qty 1

## 2016-01-12 MED ORDER — METHYLPREDNISOLONE SODIUM SUCC 125 MG IJ SOLR
125.0000 mg | Freq: Once | INTRAMUSCULAR | Status: AC
  Administered 2016-01-12: 125 mg via INTRAVENOUS
  Filled 2016-01-12: qty 2

## 2016-01-12 MED ORDER — DONEPEZIL HCL 5 MG PO TABS
10.0000 mg | ORAL_TABLET | Freq: Every day | ORAL | Status: DC
  Administered 2016-01-12: 10 mg via ORAL
  Filled 2016-01-12: qty 2

## 2016-01-12 MED ORDER — ASPIRIN 325 MG PO TABS
325.0000 mg | ORAL_TABLET | Freq: Once | ORAL | Status: AC
  Administered 2016-01-12: 325 mg via ORAL
  Filled 2016-01-12: qty 1

## 2016-01-12 MED ORDER — COCONUT OIL 1000 MG PO CAPS: 1000.0000 mg | ORAL_CAPSULE | Freq: Every day | ORAL | Status: DC

## 2016-01-12 MED ORDER — IPRATROPIUM-ALBUTEROL 0.5-2.5 (3) MG/3ML IN SOLN
3.0000 mL | RESPIRATORY_TRACT | Status: DC
  Administered 2016-01-12 – 2016-01-13 (×3): 3 mL via RESPIRATORY_TRACT
  Filled 2016-01-12 (×3): qty 3

## 2016-01-12 MED ORDER — HEPARIN SODIUM (PORCINE) 5000 UNIT/ML IJ SOLN
5000.0000 [IU] | Freq: Three times a day (TID) | INTRAMUSCULAR | Status: DC
  Administered 2016-01-12 – 2016-01-13 (×2): 5000 [IU] via SUBCUTANEOUS
  Filled 2016-01-12 (×4): qty 1

## 2016-01-12 MED ORDER — METHYLPREDNISOLONE SODIUM SUCC 125 MG IJ SOLR: 60.0000 mg | INTRAMUSCULAR | Status: DC

## 2016-01-12 MED ORDER — PANTOPRAZOLE SODIUM 40 MG PO TBEC
40.0000 mg | DELAYED_RELEASE_TABLET | Freq: Every day | ORAL | Status: DC
  Administered 2016-01-13: 40 mg via ORAL
  Filled 2016-01-12: qty 1

## 2016-01-12 MED ORDER — CITALOPRAM HYDROBROMIDE 10 MG PO TABS
5.0000 mg | ORAL_TABLET | Freq: Every day | ORAL | Status: DC
  Administered 2016-01-12 – 2016-01-13 (×2): 5 mg via ORAL
  Filled 2016-01-12 (×2): qty 1

## 2016-01-12 MED ORDER — PANTOPRAZOLE SODIUM 40 MG PO TBEC: 40.0000 mg | DELAYED_RELEASE_TABLET | Freq: Every day | ORAL | Status: DC

## 2016-01-12 MED ORDER — BENAZEPRIL HCL 10 MG PO TABS
20.0000 mg | ORAL_TABLET | Freq: Every day | ORAL | Status: DC
  Administered 2016-01-13: 20 mg via ORAL
  Filled 2016-01-12: qty 2

## 2016-01-12 MED ORDER — ALBUTEROL SULFATE (2.5 MG/3ML) 0.083% IN NEBU: 2.5000 mg | INHALATION_SOLUTION | RESPIRATORY_TRACT | Status: DC | PRN

## 2016-01-12 MED ORDER — NITROGLYCERIN 0.4 MG SL SUBL: 0.4000 mg | SUBLINGUAL_TABLET | SUBLINGUAL | Status: DC | PRN

## 2016-01-12 MED ORDER — ASPIRIN EC 81 MG PO TBEC
81.0000 mg | DELAYED_RELEASE_TABLET | Freq: Every day | ORAL | Status: DC
  Administered 2016-01-13: 81 mg via ORAL
  Filled 2016-01-12: qty 1

## 2016-01-12 NOTE — ED Notes (Signed)
Pt ambulated with writer, pt's O2 sats started at 94 % on RM, while ambulating pt's O2 sats dropped to 89% on RM

## 2016-01-12 NOTE — ED Provider Notes (Signed)
CSN: [id number]     Arrival date & time 01/12/16  1636 History   First MD Initiated Contact with Patient 01/12/16 1654     Chief Complaint  Patient presents with  . Pneumonia     (Consider location/radiation/quality/duration/timing/severity/associated sxs/prior Treatment) Patient is a 82 y.o. male presenting with shortness of breath.  Shortness of Breath Severity:  Mild Onset quality:  Gradual Duration:  4 days Timing:  Constant Progression:  Worsening Chronicity:  New Context: not activity and not emotional upset   Relieved by:  None tried Worsened by:  Nothing tried Ineffective treatments:  None tried Associated symptoms: cough   Associated symptoms: no abdominal pain, no fever and no wheezing     Past Medical History  Diagnosis Date  . Pacemaker     new gen. last check with insert 01/03/13  . Dementia   . Ulcer   . Hypertension   . Sinus node dysfunction (HCC)   . Hyperlipidemia   . LV dysfunction     last echo-04/30/10, EF 50-55%, impaired LV relaxation  . H/O cardiovascular stress test 04/30/2010    Persantine myoview-normal  . H/O: GI bleed   . Depression   . CHB (complete heart block) ([place]) 09/03/2015   Past Surgical History  Procedure Laterality Date  . Pacemaker insertion  1998  . Pacemaker generator change  2005    [place] DR  . Pacemaker generator change  01/03/2013    ST. Jude Accent DR RF  . Rotator cuff repair      bilateral  . US echocardiography  04/30/10    aortic root sclerotic,RV mildly dilated  . Pacemaker generator change N/A 01/03/2013    Procedure: PACEMAKER GENERATOR CHANGE;  Surgeon: Sanda Klein, MD;  Location: [facility];  Service: Cardiovascular;  Laterality: N/A;  . Sciatica      injection 06-2015   Family History  Problem Relation Age of Onset  . Diabetes Mother   . Heart disease Father   . Diabetes Brother    Social History  Substance Use Topics  . Smoking status: Former Smoker    Quit date: 11/16/2004  .  Smokeless tobacco: Never Used  . Alcohol Use: No    Review of Systems  Constitutional: Negative for fever.  Respiratory: Positive for cough and shortness of breath. Negative for wheezing.   Gastrointestinal: Negative for abdominal pain.  All other systems reviewed and are negative.     Allergies  Review of patient's allergies indicates no known allergies.  Home Medications   Prior to Admission medications   Medication Sig Start Date End Date Taking? Authorizing Provider  aspirin EC 81 MG tablet Take 81 mg by mouth daily.   Yes Historical Provider, MD  benazepril (LOTENSIN) 20 MG tablet Take 20 mg by mouth daily.     Yes Historical Provider, MD  citalopram (CELEXA) 10 MG tablet Take 0.5 tablets (5 mg total) by mouth daily. 11/26/13  Yes Kathrynn Ducking, MD  Coconut Oil 1000 MG CAPS Take 1,000 mg by mouth daily.    Yes Historical Provider, MD  donepezil (ARICEPT) 10 MG tablet TAKE 1 BY MOUTH DAILY --N C MARTIN Patient taking differently: TAKE 10 MG BY MOUTH DAILY 07/26/15  Yes Kathrynn Ducking, MD  levofloxacin (LEVAQUIN) 500 MG tablet Take 500 mg by mouth daily. 01/08/16  Yes Historical Provider, MD  Omega-3 Fatty Acids (FISH OIL TRIPLE STRENGTH) 1400 MG CAPS Take 1,400 mg by mouth daily.   Yes Historical Provider,  MD  pantoprazole (PROTONIX) 40 MG tablet Take 40 mg by mouth daily.    Yes Historical Provider, MD  donepezil (ARICEPT) 10 MG tablet Take 1 tablet (10 mg total) by mouth daily. Patient not taking: Reported on 01/12/2016 07/15/15   Dennie Bible, NP  memantine (NAMENDA) 10 MG tablet TAKE ONE TABLET BY MOUTH TWICE DAILY Patient not taking: Reported on 01/12/2016 09/13/15   Kathrynn Ducking, MD   BP 141/67 mmHg  Pulse 75  Temp(Src) 98.2 F (36.8 C) (Oral)  Resp 16  SpO2 94% Physical Exam  Constitutional: He is oriented to person, place, and time. He appears well-developed and well-nourished.  HENT:  Head: Normocephalic and atraumatic.  Eyes: Scleral icterus (mild  bilaterally) is present.  Neck: Normal range of motion.  Cardiovascular: Normal rate and regular rhythm.   Pulmonary/Chest: Effort normal. No respiratory distress. He has rales (bilaterally, R>L).  Abdominal: Soft. He exhibits no distension. There is no tenderness.  Musculoskeletal: Normal range of motion. He exhibits no edema or tenderness.  Neurological: He is alert and oriented to person, place, and time. No cranial nerve deficit.  Skin: Skin is warm and dry.  Nursing note and vitals reviewed.   ED Course  Procedures (including critical care time) Labs Review Labs Reviewed  CBC WITH DIFFERENTIAL/PLATELET - Abnormal; Notable for the following:    WBC 12.9 (*)    Neutro Abs 8.4 (*)    Monocytes Absolute 1.6 (*)    All other components within normal limits  COMPREHENSIVE METABOLIC PANEL - Abnormal; Notable for the following:    Potassium 3.4 (*)    Glucose, Bld 117 (*)    AST 58 (*)    All other components within normal limits  TROPONIN I - Abnormal; Notable for the following:    Troponin I 0.56 (*)    All other components within normal limits  BRAIN NATRIURETIC PEPTIDE    Imaging Review Dg Chest 2 View  01/12/2016  CLINICAL DATA:  82 year old with 1 week history of cough and fever. Patient currently being treated for pneumonia. Followup. EXAM: CHEST  2 VIEW COMPARISON:  01/09/2016 and earlier. FINDINGS: Cardiac silhouette mildly to moderately enlarged, unchanged dating back to 2012. Thoracic aorta mildly tortuous and atherosclerotic, unchanged. Hilar and mediastinal contours otherwise unremarkable. Improvement in the airspace opacities in the lower lobes identified on the examination 3 days ago, with only mild patchy airspace opacity persisting in the right lower lobe. Stable small right pleural effusion. Lungs otherwise clear. Pulmonary vascularity normal without evidence of pulmonary edema. Hyperinflation as noted previously. Degenerative changes throughout the thoracic spine.  IMPRESSION: 1. Improving bilateral lower lobe pneumonia since the examination 3 days ago, with only mild patchy opacity persisting in the right lower lobe. 2. Stable small right pleural effusion. 3. No new abnormality.  Stable cardiomegaly without pulmonary edema. Electronically Signed   By: Evangeline Dakin M.D.   On: 01/12/2016 17:28   I have personally reviewed and evaluated these images and lab results as part of my medical decision-making.   EKG Interpretation   Date/Time:  Sunday January 12 2016 17:49:01 EST Ventricular Rate:  70 PR Interval:  200 QRS Duration: 160 QT Interval:  449 QTC Calculation: 484 R Axis:   154 Text Interpretation:  Atrial-ventricular dual-paced rhythm No further  analysis attempted due to paced rhythm No significant change since last  tracing Confirmed by [facility] MD, Corene Cornea [phone]) on 01/12/2016 6:50:02 PM      MDM   Final diagnoses:  Elevated troponin  DOE (dyspnea on exertion)   Likely non-resolving pneumonia but also has possible jaundice per family. Will start with eval for PNA, but may need CT scan for malignancy. Will also eval for liver function. With not resolving sob and cough with increasing weakness will eval for cardiac dysfunction. Also will try duo neb and steroids as he has a h/o smoking, possibly bronchitis related.    cxr with improving opacities. Symptoms improved with duo neb, will repeat.   Positive troponin, bnp normal. ASA ordered. Will hold on heparin, could be just demand ischemia.     Merrily Pew, MD 01/12/16 [phone]

## 2016-01-12 NOTE — H&P (Addendum)
[facility] History and Physical  Kenneth Clay [id number] DOB: 1932-10-13 DOA: 01/12/2016  Referring physician: ED physician PCP: Mathews Argyle, MD  Specialists:   Chief Complaint: Cough, shortness of breath and weakness  HPI: Kenneth Clay is a 82 y.o. male with PMH of pacemaker placement (history of complete heart block), dementia, peptic ulcer disease, GI bleeding, hypertension, hyperlipidemia, GERD, depression, who presents with cough, mild shortness of breath and wheezing.  Patient reports that because of cough and shortness of breath, he was diagnosed as pneumonia by PCP, and was started with Levaquin on 01/08/16. He had negative Flu test. He has been taking Levaquin, with some improvement, but still has cough, mild shortness and weakness. He has dry cough, but no chest pain. Initially he had temperature 101 on 2/2 2/17, which has resolved. Currently no fever, chills. He has mild runny nose, but no sore throat. Patient does not have abdominal pain, nausea, vomiting, symptoms of a UTI or unilateral weakness.  In ED, patient was found to have WBC 12.9, elevated troponin 0.56, BNP 55.5, temperature normal, no tachycardia, no tachypnea, oxygen saturation 94, electrolytes and renal function okay. Chest x-ray showed improvement of bilateral lower lobe infiltration compare to his x-ray 3 days ago, with small amount right pleural effusion. Patients is admitted to inpatient for further eval and treatment.  EKG: Independently reviewed. QTC 484, paced rhythm  Where does patient live?   At home    Can patient participate in ADLs?  Barely   Review of Systems:   General: no fevers, chills, no changes in body weight, has poor appetite, has fatigue HEENT: no blurry vision, hearing changes or sore throat Pulm: has dyspnea, coughing, wheezing CV: no chest pain, no palpitations Abd: no nausea, vomiting, abdominal pain, diarrhea, constipation GU: no dysuria, burning on urination,  increased urinary frequency, hematuria  Ext: no leg edema Neuro: no unilateral weakness, numbness, or tingling, no vision change or hearing loss Skin: no rash MSK: No muscle spasm, no deformity, no limitation of range of movement in spin Heme: No easy bruising.  Travel history: No recent long distant travel.  Allergy: No Known Allergies  Past Medical History  Diagnosis Date  . Pacemaker     new gen. last check with insert 01/03/13  . Dementia   . Ulcer   . Hypertension   . Sinus node dysfunction (HCC)   . Hyperlipidemia   . LV dysfunction     last echo-04/30/10, EF 50-55%, impaired LV relaxation  . H/O cardiovascular stress test 04/30/2010    Persantine myoview-normal  . H/O: GI bleed   . Depression   . CHB (complete heart block) ([place]) 09/03/2015    Past Surgical History  Procedure Laterality Date  . Pacemaker insertion  1998  . Pacemaker generator change  2005    Force DR  . Pacemaker generator change  01/03/2013    [employer] DR RF  . Rotator cuff repair      bilateral  . US echocardiography  04/30/10    aortic root sclerotic,RV mildly dilated  . Pacemaker generator change N/A 01/03/2013    Procedure: PACEMAKER GENERATOR CHANGE;  Surgeon: Sanda Klein, MD;  Location: [facility];  Service: Cardiovascular;  Laterality: N/A;  . Sciatica      injection 06-2015    Social History:  reports that he quit smoking about 11 years ago. He has never used smokeless tobacco. He reports that he does not drink alcohol or use illicit  drugs.  Family History:  Family History  Problem Relation Age of Onset  . Diabetes Mother   . Heart disease Father   . Diabetes Brother      Prior to Admission medications   Medication Sig Start Date End Date Taking? Authorizing Provider  aspirin EC 81 MG tablet Take 81 mg by mouth daily.   Yes Historical Provider, MD  benazepril (LOTENSIN) 20 MG tablet Take 20 mg by mouth daily.     Yes Historical Provider, MD  citalopram  (CELEXA) 10 MG tablet Take 0.5 tablets (5 mg total) by mouth daily. 11/26/13  Yes Kathrynn Ducking, MD  Coconut Oil 1000 MG CAPS Take 1,000 mg by mouth daily.    Yes Historical Provider, MD  donepezil (ARICEPT) 10 MG tablet TAKE 1 BY MOUTH DAILY --N C MARTIN Patient taking differently: TAKE 10 MG BY MOUTH DAILY 07/26/15  Yes Kathrynn Ducking, MD  levofloxacin (LEVAQUIN) 500 MG tablet Take 500 mg by mouth daily. 01/08/16  Yes Historical Provider, MD  Omega-3 Fatty Acids (FISH OIL TRIPLE STRENGTH) 1400 MG CAPS Take 1,400 mg by mouth daily.   Yes Historical Provider, MD  pantoprazole (PROTONIX) 40 MG tablet Take 40 mg by mouth daily.    Yes Historical Provider, MD  donepezil (ARICEPT) 10 MG tablet Take 1 tablet (10 mg total) by mouth daily. Patient not taking: Reported on 01/12/2016 07/15/15   Dennie Bible, NP  memantine (NAMENDA) 10 MG tablet TAKE ONE TABLET BY MOUTH TWICE DAILY Patient not taking: Reported on 01/12/2016 09/13/15   Kathrynn Ducking, MD    Physical Exam: Filed Vitals:   01/12/16 1647 01/12/16 2016  BP: 141/67 136/64  Pulse: 75 81  Temp: 98.2 F (36.8 C)   TempSrc: Oral   Resp: 16 22  SpO2: 94% 95%   General: Not in acute distress HEENT:       Eyes: PERRL, EOMI, no scleral icterus.       ENT: No discharge from the ears and nose, no pharynx injection, no tonsillar enlargement.        Neck: No JVD, no bruit, no mass felt. Heme: No neck lymph node enlargement. Cardiac: S1/S2, RRR, No murmurs, No gallops or rubs. Pulm: decreased air movement bilaterally. Has mild wheezing bilaterally, No rale or rubs. Abd: Soft, nondistended, nontender, no rebound pain, no organomegaly, BS present. Ext: No pitting leg edema bilaterally. 2+DP/PT pulse bilaterally. Musculoskeletal: No joint deformities, No joint redness or warmth, no limitation of ROM in spin. Skin: No rashes.  Neuro: Alert, oriented X3, cranial nerves II-XII grossly intact, moves all extremities normally.  Psych: Patient  is not psychotic, no suicidal or hemocidal ideation.  Labs on Admission:  Basic Metabolic Panel:  Recent Labs Lab 01/12/16 1820  NA 140  K 3.4*  CL 103  CO2 27  GLUCOSE 117*  BUN 13  CREATININE 0.94  CALCIUM 9.6   Liver Function Tests:  Recent Labs Lab 01/12/16 1820  AST 58*  ALT 47  ALKPHOS 105  BILITOT 1.0  PROT 7.2  ALBUMIN 3.5   No results for input(s): LIPASE, AMYLASE in the last 168 hours. No results for input(s): AMMONIA in the last 168 hours. CBC:  Recent Labs Lab 01/12/16 1820  WBC 12.9*  NEUTROABS 8.4*  HGB 14.7  HCT 42.4  MCV 92.8  PLT 320   Cardiac Enzymes:  Recent Labs Lab 01/12/16 1820  TROPONINI 0.56*    BNP (last 3 results)  Recent Labs  01/12/16 1822  BNP 55.5    ProBNP (last 3 results) No results for input(s): PROBNP in the last 8760 hours.  CBG: No results for input(s): GLUCAP in the last 168 hours.  Radiological Exams on Admission: Dg Chest 2 View  01/12/2016  CLINICAL DATA:  82 year old with 1 week history of cough and fever. Patient currently being treated for pneumonia. Followup. EXAM: CHEST  2 VIEW COMPARISON:  01/09/2016 and earlier. FINDINGS: Cardiac silhouette mildly to moderately enlarged, unchanged dating back to 2012. Thoracic aorta mildly tortuous and atherosclerotic, unchanged. Hilar and mediastinal contours otherwise unremarkable. Improvement in the airspace opacities in the lower lobes identified on the examination 3 days ago, with only mild patchy airspace opacity persisting in the right lower lobe. Stable small right pleural effusion. Lungs otherwise clear. Pulmonary vascularity normal without evidence of pulmonary edema. Hyperinflation as noted previously. Degenerative changes throughout the thoracic spine. IMPRESSION: 1. Improving bilateral lower lobe pneumonia since the examination 3 days ago, with only mild patchy opacity persisting in the right lower lobe. 2. Stable small right pleural effusion. 3. No new  abnormality.  Stable cardiomegaly without pulmonary edema. Electronically Signed   By: Evangeline Dakin M.D.   On: 01/12/2016 17:28    Assessment/Plan Principal Problem:   CAP (community acquired pneumonia) Active Problems:   Pacemaker for sinus node dysfunction-'98, '05- Gen change 01/03/13 ([employer] Jude)   Dementia   HTN (hypertension)   Dyslipidemia   CHB (complete heart block) (HCC)   NSTEMI (non-ST elevated myocardial infarction) ([place])   DOE (dyspnea on exertion)   CAP (community acquired pneumonia): Patient has been taken oral Levaquin since 01/08/16 with some improvement. Chest x-ray showed improvement of infiltration. Clinically patient has mild shortness of breath, dry cough. No fever, chills or chest pain. He is not septic (pending lactate level). Hemodynamically stable.  - Will admit to Telemetry Bed - continue oral levaquin - Mucinex for cough - IV Solu-Medrol 60 mg daily - DuoNeb, albuterol Neb prn for SOB - Urine legionella and S. pneumococcal antigen - Follow up blood culture x2, sputum culture and respiratory virus panel - will get Procalcitonin and trend lactic acid leve - IVF: 2L of NS bolus in ED, followed by 75 mL per hour of NS  NSTEMI: trop 0.56. No any CP, likely due to demanding ischemia. - cycle CE q6 x3 and repeat her EKG in the am  - prn Nitroglycerin, Morphine, and aspirin - start lipitor 40 mg daily - Risk factor stratification: will check FLP and A1C  - 2d echo  HTN: Blood pressure 141/67. -Continue losartan  Dementia: -continue donepezil  GERD: -Protonix  Hx of complete heart block: S/p of pacemaker placement -Telemetry monitor  DVT ppx: SQ Heparin (if pt develops severe chest pain or significantly elevated trop, will be easier to switch to IV heparin or stop heparin for procedure than using Lovenox).   Code Status: Full code (pt was on DNR before, but he wants to be Full code today)  Family Communication: Yes, patient's wife and 2 daughters   at bed side Disposition Plan: Admit to inpatient   Date of Service 01/12/2016    Ivor Costa [facility] Pager [phone]  If 7PM-7AM, please contact night-coverage [email or web address] Password [facility] 01/12/2016, 8:34 PM

## 2016-01-12 NOTE — ED Notes (Signed)
Patient recently diagnosed with pneumonia, taking levaquin, reports no relief. C/o fatigue and cough. Denies SOB, N/V/D, fever/chills.

## 2016-01-12 NOTE — Progress Notes (Signed)
PHARMACIST - PHYSICIAN ORDER COMMUNICATION  CONCERNING: P&T Medication Policy on Herbal Medications  DESCRIPTION:  This patient's order for:  Coconut oil capsule has been noted.  This product(s) is classified as an "herbal" or natural product. Due to a lack of definitive safety studies or FDA approval, nonstandard manufacturing practices, plus the potential risk of unknown drug-drug interactions while on inpatient medications, the Pharmacy and Therapeutics Committee does not permit the use of "herbal" or natural products of this type within [facility]   ACTION TAKEN: The pharmacy department is unable to verify this order at this time and your patient has been informed of this safety policy. Please reevaluate patient's clinical condition at discharge and address if the herbal or natural product(s) should be resumed at that time.  Dia Sitter, PharmD, BCPS 01/12/2016 8:29 PM

## 2016-01-12 NOTE — Progress Notes (Signed)
MD Blaine Hamper notified of pt's elevated troponin 0.63 from last reading of 0.56. Will continue to monitor pt.

## 2016-01-12 NOTE — ED Notes (Signed)
Notified provider and nurse of the critical Troponin level.

## 2016-01-13 ENCOUNTER — Observation Stay (HOSPITAL_BASED_OUTPATIENT_CLINIC_OR_DEPARTMENT_OTHER): Payer: Medicare Other

## 2016-01-13 DIAGNOSIS — I340 Nonrheumatic mitral (valve) insufficiency: Secondary | ICD-10-CM

## 2016-01-13 DIAGNOSIS — I214 Non-ST elevation (NSTEMI) myocardial infarction: Secondary | ICD-10-CM

## 2016-01-13 DIAGNOSIS — J181 Lobar pneumonia, unspecified organism: Secondary | ICD-10-CM

## 2016-01-13 DIAGNOSIS — E785 Hyperlipidemia, unspecified: Secondary | ICD-10-CM | POA: Diagnosis not present

## 2016-01-13 DIAGNOSIS — F0390 Unspecified dementia without behavioral disturbance: Secondary | ICD-10-CM | POA: Diagnosis not present

## 2016-01-13 DIAGNOSIS — Z950 Presence of cardiac pacemaker: Secondary | ICD-10-CM | POA: Diagnosis not present

## 2016-01-13 DIAGNOSIS — I10 Essential (primary) hypertension: Secondary | ICD-10-CM | POA: Diagnosis not present

## 2016-01-13 LAB — CBC
HCT: 40.6 % (ref 39.0–52.0)
Hemoglobin: 13.4 g/dL (ref 13.0–17.0)
MCH: 31 pg (ref 26.0–34.0)
MCHC: 33 g/dL (ref 30.0–36.0)
MCV: 94 fL (ref 78.0–100.0)
Platelets: 337 10*3/uL (ref 150–400)
RBC: 4.32 MIL/uL (ref 4.22–5.81)
RDW: 13.4 % (ref 11.5–15.5)
WBC: 10.8 10*3/uL — AB (ref 4.0–10.5)

## 2016-01-13 LAB — COMPREHENSIVE METABOLIC PANEL
ALT: 38 U/L (ref 17–63)
AST: 38 U/L (ref 15–41)
Albumin: 3.2 g/dL — ABNORMAL LOW (ref 3.5–5.0)
Alkaline Phosphatase: 90 U/L (ref 38–126)
Anion gap: 11 (ref 5–15)
BILIRUBIN TOTAL: 0.7 mg/dL (ref 0.3–1.2)
BUN: 14 mg/dL (ref 6–20)
CO2: 21 mmol/L — ABNORMAL LOW (ref 22–32)
CREATININE: 0.9 mg/dL (ref 0.61–1.24)
Calcium: 9 mg/dL (ref 8.9–10.3)
Chloride: 108 mmol/L (ref 101–111)
GFR calc Af Amer: >60 mL/min (ref >60)
Glucose, Bld: 215 mg/dL — ABNORMAL HIGH (ref 65–99)
Potassium: 3.4 mmol/L — ABNORMAL LOW (ref 3.5–5.1)
Sodium: 140 mmol/L (ref 135–145)
TOTAL PROTEIN: 6.5 g/dL (ref 6.5–8.1)

## 2016-01-13 LAB — LACTIC ACID, PLASMA: Lactic Acid, Venous: 2 mmol/L (ref 0.5–2.0)

## 2016-01-13 LAB — TROPONIN I
TROPONIN I: 0.51 ng/mL — AB (ref <0.031)
TROPONIN I: 0.54 ng/mL — AB (ref <0.031)
Troponin I: 0.54 ng/mL (ref <0.031)

## 2016-01-13 LAB — LIPID PANEL
CHOL/HDL RATIO: 7.4 ratio
CHOLESTEROL: 163 mg/dL (ref 0–200)
HDL: 22 mg/dL — ABNORMAL LOW (ref >40)
LDL Cholesterol: 117 mg/dL — ABNORMAL HIGH (ref 0–99)
TRIGLYCERIDES: 120 mg/dL (ref <150)
VLDL: 24 mg/dL (ref 0–40)

## 2016-01-13 LAB — STREP PNEUMONIAE URINARY ANTIGEN: Strep Pneumo Urinary Antigen: NEGATIVE

## 2016-01-13 MED ORDER — ATORVASTATIN CALCIUM 10 MG PO TABS: 10.0000 mg | ORAL_TABLET | Freq: Every day | ORAL | Status: DC

## 2016-01-13 MED ORDER — IPRATROPIUM-ALBUTEROL 0.5-2.5 (3) MG/3ML IN SOLN
3.0000 mL | Freq: Three times a day (TID) | RESPIRATORY_TRACT | Status: DC
  Administered 2016-01-13 (×2): 3 mL via RESPIRATORY_TRACT
  Filled 2016-01-13 (×2): qty 3

## 2016-01-13 NOTE — Discharge Summary (Signed)
Physician Discharge Summary  Kenneth Clay [id number] DOB: 04/15/1932 DOA: 01/12/2016  PCP: Mathews Argyle, MD  Admit date: 01/12/2016 Discharge date: 01/13/2016  Recommendations for Outpatient Follow-up:  Continue Levaquin as per prior to the admission, should finish through 2/29 New medication is atorvastatin 10 mg daily. LDL is 117 and the goal is less than 100.  Discharge Diagnoses:  Principal Problem:   CAP (community acquired pneumonia) / Lobar pneumonia unspecified organism  Active Problems:   Pacemaker for sinus node dysfunction-'98, '05- Gen change 01/03/13 (St Jude)   Dementia without behavioral disturbance    HTN (hypertension), essential    Dyslipidemia   NSTEMI (non-ST elevated myocardial infarction) ([place])   DOE (dyspnea on exertion)    Discharge Condition: stable   Diet recommendation: as tolerated   History of present illness:   Per admission HPI "82 y.o. male with PMH of pacemaker placement (history of complete heart block), dementia, peptic ulcer disease, GI bleeding, hypertension, hyperlipidemia, GERD, depression, who presents with cough, mild shortness of breath and wheezing.  Patient reports that because of cough and shortness of breath, he was diagnosed as pneumonia by PCP, and was started with Levaquin on 01/08/16. He had negative Flu test. He has been taking Levaquin, with some improvement, but still has cough, mild shortness and weakness. He has dry cough, but no chest pain. Initially he had temperature 101 on 2/2 2/17, which has resolved. Currently no fever, chills. He has mild runny nose, but no sore throat. Patient does not have abdominal pain, nausea, vomiting, symptoms of a UTI or unilateral weakness.  In ED, patient was found to have WBC 12.9, elevated troponin 0.56, BNP 55.5, temperature normal, no tachycardia, no tachypnea, oxygen saturation 94, electrolytes and renal function okay. Chest x-ray showed improvement of bilateral lower lobe  infiltration compare to his x-ray 3 days ago, with small amount right pleural effusion. Patients admitted to inpatient for further eval and treatment."  Hospital Course:   Principal Problem: Lobar pneumonia, unspecified organism / dyspnea on exertion / Leukocytosis  - Chest x-ray on the admission showed improving bilateral lower lobe pneumonia since few days ago - He is still on Levaquin which he will continue on discharge - Stable respiratory status - Strep pneumonia is negative - Respiratory culture, blood cultures and respiratory virus panel are still pending but patient insisting on going home. His daughter at the bedside also insisting that he goes home today.   Active problems: Pacemaker for sinus node dysfunction-'98, '05- Gen change 01/03/13 (St Jude)  - Stable - No reports of chest pain  Essential hypertension - Continue benazepril  Troponin elevation / NSTEMI - Likely demand ischemia from pneumonia - Stable troponin level - No reports of chest pain - He is LDL is 117, goal would be less than 100 so we'll start low-dose statin, atorvastatin 10 mg at bedtime - Continue aspirin daily - Outpatient follow-up  Dementia without behavioral disturbance - Continue Aricept - Continue Celexa   Signed:  Leisa Lenz, MD  [facility] 01/13/2016, 10:57 AM  Pager #: [phone]  Time spent in minutes: more than 30 minutes   Discharge Exam: Filed Vitals:   01/12/16 2124 01/13/16 0437  BP: 111/52 127/63  Pulse: 70 70  Temp: 97.8 F (36.6 C) 97.5 F (36.4 C)  Resp: 20 20   Filed Vitals:   01/12/16 2124 01/13/16 0007 01/13/16 0437 01/13/16 0936  BP: 111/52  127/63   Pulse: 70  70   Temp: 97.8 F (36.6 C)  97.5 F (36.4 C)   TempSrc: Oral  Oral   Resp: 20  20   Height: 5\' 7"  (1.702 m)     Weight: 79.2 kg (174 lb 9.7 oz)  79.8 kg (175 lb 14.8 oz)   SpO2: 96% 94% 95% 92%    General: Pt is alert, not in acute distress Cardiovascular: Regular rate and  rhythm, S1/S2 appreciated  Respiratory: Clear to auscultation bilaterally, no wheezing, no crackles, no rhonchi Abdominal: Soft, non tender, non distended, bowel sounds +, no guarding Extremities: no edema, no cyanosis, pulses palpable bilaterally DP and PT Neuro: Grossly nonfocal  Discharge Instructions  Discharge Instructions    Call MD for:  difficulty breathing, headache or visual disturbances    Complete by:  As directed      Call MD for:  persistant dizziness or light-headedness    Complete by:  As directed      Call MD for:  persistant nausea and vomiting    Complete by:  As directed      Call MD for:  severe uncontrolled pain    Complete by:  As directed      Diet - low sodium heart healthy    Complete by:  As directed      Discharge instructions    Complete by:  As directed   Continue Levaquin as per prior to the admission, should finish through 2/29 New medication is atorvastatin 10 mg daily. LDL is 227 and the goal is less than 100.     Increase activity slowly    Complete by:  As directed             Medication List    STOP taking these medications        memantine 10 MG tablet  Commonly known as:  NAMENDA      TAKE these medications        aspirin EC 81 MG tablet  Take 81 mg by mouth daily.     atorvastatin 10 MG tablet  Commonly known as:  LIPITOR  Take 1 tablet (10 mg total) by mouth daily at 6 PM.     benazepril 20 MG tablet  Commonly known as:  LOTENSIN  Take 20 mg by mouth daily.     citalopram 10 MG tablet  Commonly known as:  CELEXA  Take 0.5 tablets (5 mg total) by mouth daily.     Coconut Oil 1000 MG Caps  Take 1,000 mg by mouth daily.     donepezil 10 MG tablet  Commonly known as:  ARICEPT  TAKE 1 BY MOUTH DAILY --N C MARTIN     FISH OIL TRIPLE STRENGTH 1400 MG Caps  Take 1,400 mg by mouth daily.     levofloxacin 500 MG tablet  Commonly known as:  LEVAQUIN  Take 500 mg by mouth daily.     pantoprazole 40 MG tablet  Commonly  known as:  PROTONIX  Take 40 mg by mouth daily.           Follow-up Information    Follow up with Mathews Argyle, MD. Schedule an appointment as soon as possible for a visit in 1 week.   Specialty:  Internal Medicine   Why:  Follow up appt after recent hospitalization   Contact information:   [address] [place] [address] [phone]        The results of significant diagnostics from this hospitalization (including imaging, microbiology, ancillary and laboratory) are listed below  for reference.    Significant Diagnostic Studies: Dg Chest 2 View  01/12/2016  CLINICAL DATA:  82 year old with 1 week history of cough and fever. Patient currently being treated for pneumonia. Followup. EXAM: CHEST  2 VIEW COMPARISON:  01/09/2016 and earlier. FINDINGS: Cardiac silhouette mildly to moderately enlarged, unchanged dating back to 2012. Thoracic aorta mildly tortuous and atherosclerotic, unchanged. Hilar and mediastinal contours otherwise unremarkable. Improvement in the airspace opacities in the lower lobes identified on the examination 3 days ago, with only mild patchy airspace opacity persisting in the right lower lobe. Stable small right pleural effusion. Lungs otherwise clear. Pulmonary vascularity normal without evidence of pulmonary edema. Hyperinflation as noted previously. Degenerative changes throughout the thoracic spine. IMPRESSION: 1. Improving bilateral lower lobe pneumonia since the examination 3 days ago, with only mild patchy opacity persisting in the right lower lobe. 2. Stable small right pleural effusion. 3. No new abnormality.  Stable cardiomegaly without pulmonary edema. Electronically Signed   By: Evangeline Dakin M.D.   On: 01/12/2016 17:28   Dg Chest 2 View  01/09/2016  CLINICAL DATA:  New diagnosis of bilateral pneumonia ; 1 week of cough, began antibiotics yesterday EXAM: CHEST  2 VIEW COMPARISON:  PA and lateral chest x-ray of December 28, 2012  FINDINGS: The lungs are less well inflated on today's study. There is patchy interstitial infiltrate within the right upper upper and right lower lobe and likely in the left lower lobe. The heart is normal in size. The pulmonary vascularity is not engorged. There is tortuosity of the descending thoracic aorta. The permanent pacemaker is in stable position. The left hemidiaphragm is partially obscured likely due to infiltrate. IMPRESSION: Bilateral interstitial and early alveolar pneumonia. Probable underlying COPD or reactive airway disease. Followup PA and lateral chest X-ray is recommended in 3-4 weeks following trial of antibiotic therapy to ensure resolution and exclude underlying malignancy. Electronically Signed   By: David  Martinique M.D.   On: 01/09/2016 11:15   Dg Chest Port 1 View  01/12/2016  CLINICAL DATA:  Shortness of Breath EXAM: PORTABLE CHEST 1 VIEW COMPARISON:  01/12/2016 FINDINGS: Cardiac shadow is stable. A pacemaker is again identified and stable. The lungs are well aerated bilaterally. Some mild persistent changes remain in the right base. The overall appearance is similar to that seen on the prior exam of 3 hours previous. IMPRESSION: No change from 3 hours ago. Electronically Signed   By: Inez Catalina M.D.   On: 01/12/2016 21:03    Microbiology: No results found for this or any previous visit (from the past 240 hour(s)).   Labs: Basic Metabolic Panel:  Recent Labs Lab 01/12/16 1820 01/13/16 0443  NA 140 140  K 3.4* 3.4*  CL 103 108  CO2 27 21*  GLUCOSE 117* 215*  BUN 13 14  CREATININE 0.94 0.90  CALCIUM 9.6 9.0   Liver Function Tests:  Recent Labs Lab 01/12/16 1820 01/13/16 0443  AST 58* 38  ALT 47 38  ALKPHOS 105 90  BILITOT 1.0 0.7  PROT 7.2 6.5  ALBUMIN 3.5 3.2*   No results for input(s): LIPASE, AMYLASE in the last 168 hours. No results for input(s): AMMONIA in the last 168 hours. CBC:  Recent Labs Lab 01/12/16 1820 01/13/16 0443  WBC 12.9* 10.8*   NEUTROABS 8.4*  --   HGB 14.7 13.4  HCT 42.4 40.6  MCV 92.8 94.0  PLT 320 337   Cardiac Enzymes:  Recent Labs Lab 01/12/16 1820 01/12/16 2047 01/12/16 2343  01/13/16 0443  TROPONINI 0.56* 0.63* 0.54* 0.51*   BNP: BNP (last 3 results)  Recent Labs  01/12/16 1822  BNP 55.5    ProBNP (last 3 results) No results for input(s): PROBNP in the last 8760 hours.  CBG: No results for input(s): GLUCAP in the last 168 hours.

## 2016-01-13 NOTE — Progress Notes (Signed)
Echocardiogram 2D Echocardiogram has been performed.  Kenneth Clay 01/13/2016, 2:06 PM

## 2016-01-13 NOTE — Discharge Instructions (Signed)
Atorvastatin tablets °What is this medicine? °ATORVASTATIN (a TORE va sta tin) is known as a HMG-CoA reductase inhibitor or 'statin'. It lowers the level of cholesterol and triglycerides in the blood. This drug may also reduce the risk of heart attack, stroke, or other health problems in patients with risk factors for heart disease. Diet and lifestyle changes are often used with this drug. °This medicine may be used for other purposes; ask your health care provider or pharmacist if you have questions. °What should I tell my health care provider before I take this medicine? °They need to know if you have any of these conditions: °-frequently drink alcoholic beverages °-history of stroke, TIA °-kidney disease °-liver disease °-muscle aches or weakness °-other medical condition °-an unusual or allergic reaction to atorvastatin, other medicines, foods, dyes, or preservatives °-pregnant or trying to get pregnant °-breast-feeding °How should I use this medicine? °Take this medicine by mouth with a glass of water. Follow the directions on the prescription label. You can take this medicine with or without food. Take your doses at regular intervals. Do not take your medicine more often than directed. °Talk to your pediatrician regarding the use of this medicine in children. While this drug may be prescribed for children as young as 10 years old for selected conditions, precautions do apply. °Overdosage: If you think you have taken too much of this medicine contact a poison control center or emergency room at once. °NOTE: This medicine is only for you. Do not share this medicine with others. °What if I miss a dose? °If you miss a dose, take it as soon as you can. If it is almost time for your next dose, take only that dose. Do not take double or extra doses. °What may interact with this medicine? °Do not take this medicine with any of the following medications: °-red yeast  rice °-telaprevir °-telithromycin °-voriconazole °This medicine may also interact with the following medications: °-alcohol °-antiviral medicines for HIV or AIDS °-boceprevir °-certain antibiotics like clarithromycin, erythromycin, troleandomycin °-certain medicines for cholesterol like fenofibrate or gemfibrozil °-cimetidine °-clarithromycin °-colchicine °-cyclosporine °-digoxin °-male hormones, like estrogens or progestins and birth control pills °-grapefruit juice °-medicines for fungal infections like fluconazole, itraconazole, ketoconazole °-niacin °-rifampin °-spironolactone °This list may not describe all possible interactions. Give your health care provider a list of all the medicines, herbs, non-prescription drugs, or dietary supplements you use. Also tell them if you smoke, drink alcohol, or use illegal drugs. Some items may interact with your medicine. °What should I watch for while using this medicine? °Visit your doctor or health care professional for regular check-ups. You may need regular tests to make sure your liver is working properly. °Tell your doctor or health care professional right away if you get any unexplained muscle pain, tenderness, or weakness, especially if you also have a fever and tiredness. Your doctor or health care professional may tell you to stop taking this medicine if you develop muscle problems. If your muscle problems do not go away after stopping this medicine, contact your health care professional. °This drug is only part of a total heart-health program. Your doctor or a dietician can suggest a low-cholesterol and low-fat diet to help. Avoid alcohol and smoking, and keep a proper exercise schedule. °Do not use this drug if you are pregnant or breast-feeding. Serious side effects to an unborn child or to an infant are possible. Talk to your doctor or pharmacist for more information. °This medicine may affect blood sugar levels. If   you have diabetes, check with your doctor  or health care professional before you change your diet or the dose of your diabetic medicine. °If you are going to have surgery tell your health care professional that you are taking this drug. °What side effects may I notice from receiving this medicine? °Side effects that you should report to your doctor or health care professional as soon as possible: °-allergic reactions like skin rash, itching or hives, swelling of the face, lips, or tongue °-dark urine °-fever °-joint pain °-muscle cramps, pain °-redness, blistering, peeling or loosening of the skin, including inside the mouth °-trouble passing urine or change in the amount of urine °-unusually weak or tired °-yellowing of eyes or skin °Side effects that usually do not require medical attention (report to your doctor or health care professional if they continue or are bothersome): °-constipation °-heartburn °-stomach gas, pain, upset °This list may not describe all possible side effects. Call your doctor for medical advice about side effects. You may report side effects to FDA at 1-800-FDA-1088. °Where should I keep my medicine? °Keep out of the reach of children. °Store at room temperature between 20 to 25 degrees C (68 to 77 degrees F). Throw away any unused medicine after the expiration date. °NOTE: This sheet is a summary. It may not cover all possible information. If you have questions about this medicine, talk to your doctor, pharmacist, or health care provider. °  °© 2016, [employer]vier/[employer]. (2011-09-22 09:18:24) ° °

## 2016-01-13 NOTE — Evaluation (Signed)
Physical Therapy Evaluation Patient Details Name: Kenneth Clay MRN: [id number] DOB: August 15, 1932 Today's Date: 01/13/2016   History of Present Illness  TANUJ TENZER is a 82 y.o. male with PMH of pacemaker placement (history of complete heart block), dementia, peptic ulcer disease, GI bleeding, hypertension, hyperlipidemia, GERD, depression, who presents with cough, mild shortness of breath and wheezing.  Clinical Impression  Pt admitted with above diagnosis. Pt currently with functional limitations due to the deficits listed below (see PT Problem List).  Pt will benefit from skilled PT to increase their independence and safety with mobility to allow discharge to the venue listed below.       Follow Up Recommendations Home health PT    Equipment Recommendations  None recommended by PT    Recommendations for Other Services       Precautions / Restrictions Precautions Precautions: Fall      Mobility  Bed Mobility               General bed mobility comments: nT - pt in chair  Transfers Overall transfer level: Needs assistance   Transfers: Sit to/from Stand Sit to Stand: Supervision         General transfer comment: for safety  Ambulation/Gait Ambulation/Gait assistance: Min assist;Min guard Ambulation Distance (Feet): 250 Feet Assistive device: None Gait Pattern/deviations: Decreased stride length;Drifts right/left     General Gait Details: pt intermittitently unsteady, requiring min to recover balance x2  Stairs            Wheelchair Mobility    Modified Rankin (Stroke Patients Only)       Balance Overall balance assessment: Needs assistance Sitting-balance support: Feet supported;No upper extremity supported Sitting balance-Leahy Scale: Normal       Standing balance-Leahy Scale: Fair Standing balance comment: pt can tol only min challenges before  LOB             High level balance activites: Backward walking;Direction  changes;Turns;Sudden stops;Head turns High Level Balance Comments: LOB x 2-3, delayed recovery             Pertinent Vitals/Pain Pain Assessment: No/denies pain    Home Living Family/patient expects to be discharged to:: Private residence Living Arrangements: Spouse/significant other;Children Available Help at Discharge: Available 24 hours/day (initally will have someone there all the time per dtr) Type of Home: House Home Access: Stairs to enter     Home Layout: One level Home Equipment: None Additional Comments: wife has dementia also, pt wife is staying with dtr currently, they are beginning to think about transitioning parents to incr level of care (siblings not all  in agreement yet)    Prior Function Level of Independence: Independent               Hand Dominance        Extremity/Trunk Assessment   Upper Extremity Assessment: Defer to OT evaluation;Overall WFL for tasks assessed           Lower Extremity Assessment: Overall WFL for tasks assessed         Communication   Communication: No difficulties  Cognition Arousal/Alertness: Awake/alert Behavior During Therapy: WFL for tasks assessed/performed Overall Cognitive Status: Within Functional Limits for tasks assessed                      General Comments      Exercises        Assessment/Plan    PT Assessment Patient needs continued PT services  PT  Diagnosis Difficulty walking   PT Problem List Decreased mobility;Decreased activity tolerance;Decreased balance  PT Treatment Interventions DME instruction;Functional mobility training;Therapeutic activities;Gait training;Therapeutic exercise;Patient/family education;Balance training   PT Goals (Current goals can be found in the Care Plan section) Acute Rehab PT Goals Patient Stated Goal: home soon PT Goal Formulation: With patient Time For Goal Achievement: 01/20/16 Potential to Achieve Goals: Good    Frequency Min 3X/week    Barriers to discharge        Co-evaluation               End of Session Equipment Utilized During Treatment: Gait belt Activity Tolerance: Patient tolerated treatment well Patient left: in chair;with call bell/phone within reach;with chair alarm set;with family/visitor present           Time: [id number] PT Time Calculation (min) (ACUTE ONLY): 12 min   Charges:   PT Evaluation $PT Eval Low Complexity: 1 Procedure     PT G Codes:        Yameli Delamater 02/08/16, 10:27 AM

## 2016-01-13 NOTE — Progress Notes (Signed)
Spoke with pt and family at bedside concerning [place] needs. Pt's daughterTeresa at bedside selected [facility]. Home health was completed at 1230 AM for this pt.

## 2016-01-13 NOTE — Evaluation (Signed)
Occupational Therapy Evaluation Patient Details Name: Kenneth Clay MRN: [id number] DOB: 05-30-1932 Today's Date: 01/13/2016    History of Present Illness Kenneth Clay is a 82 y.o. male with PMH of pacemaker placement (history of complete heart block), dementia, peptic ulcer disease, GI bleeding, hypertension, hyperlipidemia, GERD, depression, who presents with cough, mild shortness of breath and wheezing.   Clinical Impression   Pt overall at baseline level with ADL activity- needing S with ADL activity in which daughter provides        Equipment Recommendations  Other (comment) (rolling walker)       Precautions / Restrictions Precautions Precautions: Fall      Mobility Bed Mobility Overal bed mobility: Independent                Transfers Overall transfer level: Needs assistance   Transfers: Sit to/from Stand;Stand Pivot Transfers Sit to Stand: Supervision Stand pivot transfers: Supervision       General transfer comment: for safety    Balance Overall balance assessment: Needs assistance Sitting-balance support: Feet supported;No upper extremity supported Sitting balance-Leahy Scale: Normal       Standing balance-Leahy Scale: Fair Standing balance comment: pt can tol only min challenges before  LOB             High level balance activites: Backward walking;Direction changes;Turns;Sudden stops;Head turns High Level Balance Comments: LOB x 2-3, delayed recovery            ADL Overall ADL's : At baseline                                       General ADL Comments: Pt overall S with ADL activity as this was his baseline. Pt did need Vc to slow down and take his time. Pts daugther aware and will provide VC for him to slow down and take his time to decrease chance of falling               Pertinent Vitals/Pain Pain Assessment: No/denies pain     Hand Dominance     Extremity/Trunk Assessment Upper Extremity  Assessment Upper Extremity Assessment: Overall WFL for tasks assessed           Communication Communication Communication: No difficulties   Cognition Arousal/Alertness: Awake/alert Behavior During Therapy: WFL for tasks assessed/performed Overall Cognitive Status: Within Functional Limits for tasks assessed                           Shoulder Instructions      Home Living Family/patient expects to be discharged to:: Private residence Living Arrangements: Spouse/significant other   Type of Home: House Home Access: Stairs to enter     Home Layout: One level               Home Equipment: None   Additional Comments: wife has dementia also, pt wife is staying with dtr currently, they are beginning to think about transitioning parents to incr level of care (siblings not all  in agreement yet)      Prior Functioning/Environment Level of Independence: Independent                      OT Goals(Current goals can be found in the care plan section) Acute Rehab OT Goals Patient Stated Goal: home soon OT Goal Formulation: With patient  OT  Frequency:                End of Session Nurse Communication: Mobility status  Activity Tolerance: Patient tolerated treatment well Patient left: in chair;with call bell/phone within reach;with chair alarm set;with family/visitor present   Time: [id number] OT Time Calculation (min): 18 min Charges:  OT General Charges $OT Visit: 1 Procedure OT Evaluation $OT Eval Low Complexity: 1 Procedure OT Treatments $Self Care/Home Management : 8-22 mins G-Codes:    Betsy Pries 26-Jan-2016, 2:23 PM

## 2016-01-14 ENCOUNTER — Ambulatory Visit: Payer: Medicare Other | Admitting: Nurse Practitioner

## 2016-01-14 LAB — RESPIRATORY VIRUS PANEL
ADENOVIRUS: NEGATIVE
Influenza A: NEGATIVE
Influenza B: NEGATIVE
METAPNEUMOVIRUS: NEGATIVE
PARAINFLUENZA 1 A: NEGATIVE
Parainfluenza 2: NEGATIVE
Parainfluenza 3: NEGATIVE
RESPIRATORY SYNCYTIAL VIRUS A: NEGATIVE
RESPIRATORY SYNCYTIAL VIRUS B: NEGATIVE
RHINOVIRUS: NEGATIVE

## 2016-01-14 LAB — LEGIONELLA ANTIGEN, URINE

## 2016-01-14 LAB — HEMOGLOBIN A1C
HEMOGLOBIN A1C: 5.9 % — AB (ref 4.8–5.6)
MEAN PLASMA GLUCOSE: 123 mg/dL

## 2016-01-15 NOTE — Progress Notes (Signed)
   01/13/16 1100  PT G-Codes **NOT FOR INPATIENT CLASS**  Functional Assessment Tool Used clinical observation  Functional Limitation Mobility: Walking and moving around  Mobility: Walking and Moving Around Current Status [phone]) CI  Mobility: Walking and Moving Around Goal Status [phone]) CI   Kenyon Ana, PT Pager: [phone] 01/15/2016

## 2016-01-18 LAB — CULTURE, BLOOD (ROUTINE X 2)
CULTURE: NO GROWTH
Culture: NO GROWTH

## 2016-01-22 ENCOUNTER — Telehealth: Payer: Self-pay | Admitting: Cardiovascular Disease

## 2016-01-22 NOTE — Telephone Encounter (Signed)
New message     Pt has been in the hosp with pheumonia.  He is very "sluggishness".  Could this be from the pneumonia or could his pacemaker be causing the sluggishness?  Can you look at his transmissions to see if there is a problem?

## 2016-01-22 NOTE — Telephone Encounter (Signed)
Transmission was received- no change since 12/03/15 transmission- histograms stable, 100% pacing, battery stable. Ms Kenneth Clay reports that they are returning to PCP on Friday and she will make him aware of continued fatigue- I agreed with her plan. Follow-up with EP as scheduled.

## 2016-01-22 NOTE — Telephone Encounter (Signed)
Ms. Fernande Boyden was instructed on how to send a manual transmission- She will go to the patient's house now and attempt. She will call me back directly with any questions. I will call her back after transmission has been reviewed. She is agreeable with plan.

## 2016-01-24 ENCOUNTER — Other Ambulatory Visit: Payer: Self-pay | Admitting: Geriatric Medicine

## 2016-01-24 ENCOUNTER — Ambulatory Visit
Admission: RE | Admit: 2016-01-24 | Discharge: 2016-01-24 | Disposition: A | Discharge status: Home / Self Care | Payer: Medicare Other | Source: Ambulatory Visit | Attending: Geriatric Medicine | Admitting: Geriatric Medicine

## 2016-01-24 DIAGNOSIS — J13 Pneumonia due to Streptococcus pneumoniae: Secondary | ICD-10-CM

## 2016-01-31 ENCOUNTER — Ambulatory Visit: Payer: Medicare Other | Admitting: Nurse Practitioner

## 2016-03-03 ENCOUNTER — Ambulatory Visit (INDEPENDENT_AMBULATORY_CARE_PROVIDER_SITE_OTHER): Payer: Medicare Other | Admitting: *Deleted

## 2016-03-03 ENCOUNTER — Ambulatory Visit: Payer: Medicare Other | Admitting: Nurse Practitioner

## 2016-03-03 DIAGNOSIS — I495 Sick sinus syndrome: Secondary | ICD-10-CM | POA: Diagnosis not present

## 2016-03-03 NOTE — Progress Notes (Signed)
Remote pacemaker transmission.   

## 2016-03-05 ENCOUNTER — Ambulatory Visit: Payer: Medicare Other | Admitting: Nurse Practitioner

## 2016-03-20 ENCOUNTER — Ambulatory Visit (INDEPENDENT_AMBULATORY_CARE_PROVIDER_SITE_OTHER): Payer: Medicare Other | Admitting: Nurse Practitioner

## 2016-03-20 ENCOUNTER — Encounter: Payer: Self-pay | Admitting: Nurse Practitioner

## 2016-03-20 VITALS — BP 138/66 | HR 72 | Ht 67.0 in | Wt 183.6 lb

## 2016-03-20 DIAGNOSIS — G309 Alzheimer's disease, unspecified: Secondary | ICD-10-CM | POA: Diagnosis not present

## 2016-03-20 DIAGNOSIS — F0280 Dementia in other diseases classified elsewhere without behavioral disturbance: Secondary | ICD-10-CM | POA: Diagnosis not present

## 2016-03-20 MED ORDER — MEMANTINE HCL-DONEPEZIL HCL ER 28-10 MG PO CP24: 1.0000 | ORAL_CAPSULE | Freq: Every day | ORAL | Status: DC

## 2016-03-20 NOTE — Progress Notes (Signed)
[facility]  PATIENT: ANOOP BISSETT DOB: 12-21-1931   REASON FOR VISIT: Follow-up for Alzheimer's dementia HISTORY FROM: Daughter and patient    HISTORY OF PRESENT ILLNESS:Mr. Roosevelt is a 82year-old right-handed white male with a history of a slowly progressive memory disorder. He was last seen in this office 07/15/15.  He is on Namenda 10 twice daily. Marland Kitchen He remains on Aricept  10mg  daily. He gets no regular exercise. Appetite is reportedly good and he is sleeping well at night. There is no wandering behavior. The patient has not had any significant changes in his functional ability since last seen. He reports a good energy level during the day, he does not have any hobbies. The patient was admitted to the hospital in February for pneumonia . The patient's wife also has memory troubles. The patient returns for an evaluation.   REVIEW OF SYSTEMS: Full 14 system review of systems performed and notable only for those listed, all others are neg:  Constitutional: neg  Cardiovascular: neg Ear/Nose/Throat: neg  Skin: neg Eyes: neg Respiratory: neg Gastroitestinal: neg  Hematology/Lymphatic: neg  Endocrine: neg Musculoskeletal:neg Allergy/Immunology: neg Neurological: Memory loss  Psychiatric: neg Sleep : neg   ALLERGIES: No Known Allergies  HOME MEDICATIONS: Outpatient Prescriptions Prior to Visit  Medication Sig Dispense Refill  . aspirin EC 81 MG tablet Take 81 mg by mouth daily.    Marland Kitchen atorvastatin (LIPITOR) 10 MG tablet Take 1 tablet (10 mg total) by mouth daily at 6 PM. 30 tablet 0  . benazepril (LOTENSIN) 20 MG tablet Take 20 mg by mouth daily.      . citalopram (CELEXA) 10 MG tablet Take 0.5 tablets (5 mg total) by mouth daily. 15 tablet 1  . Coconut Oil 1000 MG CAPS Take 1,000 mg by mouth daily.     Marland Kitchen donepezil (ARICEPT) 10 MG tablet TAKE 1 BY MOUTH DAILY --N C Kari Montero (Patient taking differently: TAKE 10 MG BY MOUTH DAILY) 90 tablet 1  . Omega-3  Fatty Acids (FISH OIL TRIPLE STRENGTH) 1400 MG CAPS Take 1,400 mg by mouth daily.    . pantoprazole (PROTONIX) 40 MG tablet Take 40 mg by mouth daily.     Marland Kitchen levofloxacin (LEVAQUIN) 500 MG tablet Take 500 mg by mouth daily.  0   No facility-administered medications prior to visit.    PAST MEDICAL HISTORY: Past Medical History  Diagnosis Date  . Pacemaker     new gen. last check with insert 01/03/13  . Dementia   . Ulcer   . Hypertension   . Sinus node dysfunction (HCC)   . Hyperlipidemia   . LV dysfunction     last echo-04/30/10, EF 50-55%, impaired LV relaxation  . H/O cardiovascular stress test 04/30/2010    Persantine myoview-normal  . H/O: GI bleed   . Depression   . CHB (complete heart block) ([place]) 09/03/2015    PAST SURGICAL HISTORY: Past Surgical History  Procedure Laterality Date  . Pacemaker insertion  1998  . Pacemaker generator change  2005    [place] DR  . Pacemaker generator change  01/03/2013    [facility] RF  . Rotator cuff repair      bilateral  . US echocardiography  04/30/10    aortic root sclerotic,RV mildly dilated  . Pacemaker generator change N/A 01/03/2013    Procedure: PACEMAKER GENERATOR CHANGE;  Surgeon: Sanda Klein, MD;  Location: [facility]ATH [facility];  Service: Cardiovascular;  Laterality: N/A;  .  Sciatica      injection 06-2015    FAMILY HISTORY: Family History  Problem Relation Age of Onset  . Diabetes Mother   . Heart disease Father   . Diabetes Brother     SOCIAL HISTORY: Social History   Social History  . Marital Status: Married    Spouse Name: [name]  . Number of Children: 3  . Years of Education: N/A   Occupational History  . fire fighter-retired    Social History Main Topics  . Smoking status: Former Smoker    Quit date: 11/16/2004  . Smokeless tobacco: Never Used  . Alcohol Use: No  . Drug Use: No  . Sexual Activity: Not on file   Other Topics Concern  . Not on file   Social History Narrative      PHYSICAL EXAM  Filed Vitals:   03/20/16 1008  BP: 138/66  Pulse: 72  Height: 5\' 7"  (1.702 m)  Weight: 183 lb 9.6 oz (83.28 kg)   Body mass index is 28.75 kg/(m^2). General: The patient is alert and cooperative at the time of the examination. Skin: No significant peripheral edema is noted.   Neurologic Exam  Mental status: The Mini-Mental status examination done today shows a total score of 16/30. Last 18/30. He misses items in orientation,  calculation 3 of 3 recall and unable to copy a figure.AFT 5. Clock drawing 2/4.  Cranial nerves: Facial symmetry is present. Speech is normal, no aphasia or dysarthria is noted. Extraocular movements are full. Visual fields are full. Motor: The patient has good strength in all 4 extremities. No focal weakness Sensory examination: Soft touch sensation is symmetric on the face, arms, and legs Coordination: The patient has good finger-nose-finger and heel-to-shin bilaterally. Gait and station: The patient has a normal gait. Tandem gait is normal. Romberg is negative. No drift is seen. Reflexes: Deep tendon reflexes are symmetric. DIAGNOSTIC DATA (LABS, IMAGING, TESTING) - I reviewed patient records, labs, notes, testing and imaging myself where available.  Lab Results  Component Value Date   WBC 10.8* 01/13/2016   HGB 13.4 01/13/2016   HCT 40.6 01/13/2016   MCV 94.0 01/13/2016   PLT 337 01/13/2016      Component Value Date/Time   NA 140 01/13/2016 0443   K 3.4* 01/13/2016 0443   CL 108 01/13/2016 0443   CO2 21* 01/13/2016 0443   GLUCOSE 215* 01/13/2016 0443   BUN 14 01/13/2016 0443   CREATININE 0.90 01/13/2016 0443   CALCIUM 9.0 01/13/2016 0443   PROT 6.5 01/13/2016 0443   ALBUMIN 3.2* 01/13/2016 0443   AST 38 01/13/2016 0443   ALT 38 01/13/2016 0443   ALKPHOS 90 01/13/2016 0443   BILITOT 0.7 01/13/2016 0443   GFRNONAA >60 01/13/2016 0443   GFRAA >60 01/13/2016 0443   Lab Results  Component Value Date   CHOL 163  01/13/2016   HDL 22* 01/13/2016   LDLCALC 117* 01/13/2016   TRIG 120 01/13/2016   CHOLHDL 7.4 01/13/2016   Lab Results  Component Value Date   HGBA1C 5.9* 01/13/2016    ASSESSMENT AND PLAN 82 y.o. year old male has a past medical history of Pacemaker; Dementia; Hypertension; Sinus node dysfunction; Hyperlipidemia; LV dysfunction; Depression. here to follow-up for his memory loss. The Mini-Mental status examination done today shows a total score of 16/30. Last 18/30. He misses items in orientation calculation, and 3 of 3 recall and unable to copy a figure.AFT 5. Clock drawing 2/4  Discussed modifying his medication  regimen to taking a combination of Aricept and Namenda which is called  Namzaric. This is one capsule daily. The daughter is agreeable and he was given a prescription as well as a voucher for one free  Month. In the meantime she will check on the cost of this medication as compared to taking Aricept and Namenda. She will call with any concerns. Patient should not be driving F/U in 6 months Dennie Bible, [facility], [facility], APRN  [facility] [address], [place], [place] [address] [phone] Is

## 2016-03-20 NOTE — Progress Notes (Signed)
I have read the note, and I agree with the clinical assessment and plan.  Sostenes Kauffmann KEITH   

## 2016-03-20 NOTE — Patient Instructions (Addendum)
Change to Namzaric 1 po daily given a voucher and RX Follow up in 6 months

## 2016-03-23 ENCOUNTER — Telehealth: Payer: Self-pay | Admitting: Nurse Practitioner

## 2016-03-23 NOTE — Telephone Encounter (Signed)
Daughter Kenneth Clay  would like a call back from Daun Peacock regarding her father. She is listed on the DPR as the number 1 contact. Best call back is  [phone]

## 2016-03-23 NOTE — Telephone Encounter (Signed)
TC to daughter, no answer left message will call back later today

## 2016-03-23 NOTE — Telephone Encounter (Signed)
Telephone call to daughter Clarene Critchley left  her a voicemail message that the reason for changing the medication was to simplify the medication regimen. I will give her a call back tomorrow as I have been unable to contact her today

## 2016-03-23 NOTE — Telephone Encounter (Signed)
I spoke to [place], other daughter of pt.  She states that namzaric is not on there insurance formulary.  She asked if the generic meds he is on now (donezepil and memantine) would be as good.  Cost for this would be $18.00 for both.  I told her that compliance taking a tablet daily versus BID.  ( I did not mention slight increase in memantine dose).    She also wanted to let us know that she thought it was ok for her father to drive, short, familiar distances, trackers are on both cars.  She has ridden with her father and thinks he is ok.  I told her if namzaric is better, then will have to get PA covermy meds.  She did not know price.  I told her if pharmacy knows not formulary they may be able to give price.  [place] if want to call and discuss.

## 2016-03-24 MED ORDER — DONEPEZIL HCL 10 MG PO TABS: 10.0000 mg | ORAL_TABLET | Freq: Every day | ORAL | Status: DC

## 2016-03-24 MED ORDER — MEMANTINE HCL 10 MG PO TABS: 10.0000 mg | ORAL_TABLET | Freq: Two times a day (BID) | ORAL | Status: DC

## 2016-03-24 NOTE — Telephone Encounter (Signed)
Kenneth Clay called back, said she talked with her father about the memory test. Sts she explained to him the importance of that test in really trying to do well on it and not just saying "I can't do it". She said she has ridden with him and he does just fine. She is asking if [facility] will allow him to take the test again. Patient was teary-eyed. Please call

## 2016-03-24 NOTE — Telephone Encounter (Signed)
Driver evaluation referral form completed; faxed to [employer] with request to call daughter, Helene Kelp with appointment.

## 2016-03-24 NOTE — Telephone Encounter (Signed)
I spoke with the daughter earlier this morning about the reasoning for the medication change  however since it is not on formulary she wants to go back to Aricept 10 daily and Namenda 10 twice daily. I will Update medication list in addition she was made aware that her father should not be driving based on his Mini-Mental status exam. Repeating the exam probably with change and that he was 18 out of 30 last time he was seen6 months ago. I recommend that he go to driver rehabilitation services. She is agreeable and we will make a referral Tyler Aas please initiate referral ti driver rehab

## 2016-03-25 ENCOUNTER — Telehealth: Payer: Self-pay | Admitting: Nurse Practitioner

## 2016-03-25 MED ORDER — DONEPEZIL HCL 10 MG PO TABS: 10.0000 mg | ORAL_TABLET | Freq: Every day | ORAL | Status: DC

## 2016-03-25 NOTE — Telephone Encounter (Signed)
Pt's daughter called said donepezil (ARICEPT) 10 MG tablet need to go to [facility] (f) [phone].

## 2016-03-25 NOTE — Telephone Encounter (Signed)
Med order d/c to [facility], reordered at daughter's request, to [facility].

## 2016-04-09 ENCOUNTER — Telehealth: Payer: Self-pay | Admitting: Neurology

## 2016-04-09 NOTE — Telephone Encounter (Signed)
Patient's daughter Helene Kelp requesting a call back from Dr. Jannifer Franklin regarding possibly having another memory test done for patient. A family member was in the room at time of testing and feel the score may not be accurate. Last visit patient was told he should not be driving and has not  but wishes to drive  5 mile or 3 mile distance  to run errands near the home. Daughter's  Best call back is [phone]

## 2016-04-09 NOTE — Telephone Encounter (Signed)
I called and talked with the daughter. The patient has been scoring a 16-17 range on the Mini-Mental Status Examination for well over a year, the Mini-Mental Status Examination has been fairly stable. The daughter indicates that she has ridden with the patient and that he is safe with driving. They are pursuing and occupational evaluation through the [facility], I think this is reasonable. It may be okay for the patient to drive as the Mini-Mental Status Examination score did not fully translate into driving abilities. The Mini-Mental Status Examination has been stable. If there are concerns with the driver's evaluation, he may need to stop driving.

## 2016-04-09 NOTE — Telephone Encounter (Signed)
Pt saw Hoyle Sauer NP for last office visit on 03/20/16. He scored on MMSE: 16/30 andAFT:5/60 sec.

## 2016-04-10 ENCOUNTER — Encounter: Payer: Self-pay | Admitting: Cardiology

## 2016-04-10 LAB — CUP PACEART REMOTE DEVICE CHECK
Battery Remaining Longevity: 107 mo
Battery Voltage: 2.96 V
Brady Statistic AP VS Percent: 1 %
Brady Statistic AS VP Percent: 1 %
Brady Statistic RA Percent Paced: 99 %
Brady Statistic RV Percent Paced: 99 %
Implantable Lead Location: 753860
Lead Channel Impedance Value: 460 Ohm
Lead Channel Impedance Value: 640 Ohm
Lead Channel Pacing Threshold Amplitude: 1.625 V
Lead Channel Sensing Intrinsic Amplitude: 4.8 mV
Lead Channel Setting Sensing Sensitivity: 2 mV
MDC IDC LEAD IMPLANT DT: 19980305
MDC IDC LEAD IMPLANT DT: 19980305
MDC IDC LEAD LOCATION: 753859
MDC IDC MSMT BATTERY REMAINING PERCENTAGE: 95.5 %
MDC IDC MSMT LEADCHNL RV PACING THRESHOLD PULSEWIDTH: 0.4 ms
MDC IDC SESS DTM: 20170418075758
MDC IDC SET LEADCHNL RA PACING AMPLITUDE: 2 V
MDC IDC SET LEADCHNL RV PACING AMPLITUDE: 1.875
MDC IDC SET LEADCHNL RV PACING PULSEWIDTH: 0.4 ms
MDC IDC STAT BRADY AP VP PERCENT: 99 %
MDC IDC STAT BRADY AS VS PERCENT: 1 %
Pulse Gen Serial Number: 7448063

## 2016-04-17 ENCOUNTER — Telehealth: Payer: Self-pay | Admitting: *Deleted

## 2016-04-17 ENCOUNTER — Telehealth: Payer: Self-pay | Admitting: Neurology

## 2016-04-17 NOTE — Telephone Encounter (Signed)
Message For: [facility]                  Taken  2-JUN-17 at 10:56AM by [facility] ------------------------------------------------------------ Kenneth Clay              CID [id number]  Patient Kenneth Clay        Pt's Dr Jannifer Franklin       Area Code 336 Phone# [place]; PLEASE C/B                                                               Disp:Y/N N If Y = C/B If No Response In 80minutes ============================================================

## 2016-04-17 NOTE — Telephone Encounter (Signed)
The daughter wishes to canceled appointment with [facility] secondary to expense mainly. We will continue follow the memory issue over time. I called and talked with the daughter.

## 2016-04-17 NOTE — Telephone Encounter (Signed)
Pt's daughter called back. Would like Dr. Tobey Grim approval to cancel 6/21 appt w/ [facility]. Due to expense, daughter riding w/ pt and pt not wanting to drive as much, she would like to have neuro follow cognitive function at this time. Would like a return call w/ MD approval/denial.

## 2016-04-17 NOTE — Telephone Encounter (Signed)
Returned call to pt's daughter. Left VM mssg to call back to discuss further or if any additional questions/concerns.

## 2016-04-17 NOTE — Telephone Encounter (Signed)
Patient's daughter Kenneth Clay requesting call back from nurse regarding they checked into [facility] and the testing was expensive $357 and $178 for every hour over. Since patient is doing fine now and they monitor his driving they want to delay that testing until he shows further decline/ Best call back is  [phone]

## 2016-06-02 ENCOUNTER — Ambulatory Visit (INDEPENDENT_AMBULATORY_CARE_PROVIDER_SITE_OTHER): Payer: Medicare Other | Admitting: *Deleted

## 2016-06-02 DIAGNOSIS — I495 Sick sinus syndrome: Secondary | ICD-10-CM | POA: Diagnosis not present

## 2016-06-02 NOTE — Progress Notes (Signed)
Remote pacemaker transmission.   

## 2016-06-04 ENCOUNTER — Encounter: Payer: Self-pay | Admitting: Cardiology

## 2016-06-05 LAB — CUP PACEART REMOTE DEVICE CHECK
Battery Remaining Longevity: 97 mo
Battery Remaining Percentage: 91 %
Brady Statistic AP VP Percent: 99 %
Brady Statistic AP VS Percent: 1 %
Brady Statistic AS VP Percent: 1 %
Brady Statistic AS VS Percent: 1 %
Implantable Lead Implant Date: 19980305
Implantable Lead Location: 753859
Implantable Lead Location: 753860
Lead Channel Pacing Threshold Pulse Width: 0.4 ms
Lead Channel Setting Pacing Amplitude: 1.75 V
Lead Channel Setting Pacing Pulse Width: 0.4 ms
MDC IDC LEAD IMPLANT DT: 19980305
MDC IDC MSMT BATTERY VOLTAGE: 2.95 V
MDC IDC MSMT LEADCHNL RA IMPEDANCE VALUE: 450 Ohm
MDC IDC MSMT LEADCHNL RA SENSING INTR AMPL: 5 mV
MDC IDC MSMT LEADCHNL RV IMPEDANCE VALUE: 560 Ohm
MDC IDC MSMT LEADCHNL RV PACING THRESHOLD AMPLITUDE: 1.5 V
MDC IDC PG SERIAL: 7448063
MDC IDC SESS DTM: 20170718082910
MDC IDC SET LEADCHNL RA PACING AMPLITUDE: 2 V
MDC IDC SET LEADCHNL RV SENSING SENSITIVITY: 2 mV
MDC IDC STAT BRADY RA PERCENT PACED: 99 %
MDC IDC STAT BRADY RV PERCENT PACED: 99 %

## 2016-06-19 ENCOUNTER — Other Ambulatory Visit: Payer: Self-pay | Admitting: *Deleted

## 2016-06-19 MED ORDER — MEMANTINE HCL 10 MG PO TABS: 10.0000 mg | ORAL_TABLET | Freq: Two times a day (BID) | ORAL | 6 refills | Status: DC

## 2016-06-19 MED ORDER — MEMANTINE HCL 10 MG PO TABS: 10.0000 mg | ORAL_TABLET | Freq: Two times a day (BID) | ORAL | 1 refills | Status: DC

## 2016-07-23 ENCOUNTER — Telehealth: Payer: Self-pay | Admitting: Nurse Practitioner

## 2016-07-23 NOTE — Telephone Encounter (Signed)
Looks as if pt/family cancelled November appt w/ Hoyle Sauer NP.

## 2016-07-23 NOTE — Telephone Encounter (Signed)
07/23/16-pt's daughter Helene Kelp c/a appt for 09/22/16-sts Dr Felipa Eth will be handling the medication-

## 2016-07-24 NOTE — Telephone Encounter (Signed)
Dr. Felipa Eth  To take over prescribing the aricept and Namenda.

## 2016-09-07 ENCOUNTER — Telehealth: Payer: Self-pay | Admitting: Cardiovascular Disease

## 2016-09-07 ENCOUNTER — Telehealth: Payer: Self-pay | Admitting: Neurology

## 2016-09-07 NOTE — Telephone Encounter (Signed)
I called patient, talk with the daughter. The patient will be transitioning to an extended care facility. The patient is on Aricept and Namenda, I would have no problem with the doctor at the facility managing these medications and managing the memory problem.

## 2016-09-07 NOTE — Telephone Encounter (Signed)
Closed encounter °

## 2016-09-07 NOTE — Telephone Encounter (Signed)
Pt's daughter called in stating the pt has moved into [place]. Pt has an appt soon and she wants to know if they should keep the appt or let the Geriatric physician take over. Please call and advise [phone]

## 2016-09-22 ENCOUNTER — Ambulatory Visit (INDEPENDENT_AMBULATORY_CARE_PROVIDER_SITE_OTHER): Payer: Medicare Other | Admitting: Cardiovascular Disease

## 2016-09-22 ENCOUNTER — Ambulatory Visit: Payer: Medicare Other | Admitting: Nurse Practitioner

## 2016-09-22 ENCOUNTER — Encounter: Payer: Self-pay | Admitting: Cardiovascular Disease

## 2016-09-22 VITALS — BP 163/81 | HR 75 | Ht 66.0 in | Wt 184.0 lb

## 2016-09-22 DIAGNOSIS — I498 Other specified cardiac arrhythmias: Secondary | ICD-10-CM | POA: Diagnosis not present

## 2016-09-22 DIAGNOSIS — Z950 Presence of cardiac pacemaker: Secondary | ICD-10-CM | POA: Diagnosis not present

## 2016-09-22 DIAGNOSIS — I442 Atrioventricular block, complete: Secondary | ICD-10-CM | POA: Diagnosis not present

## 2016-09-22 DIAGNOSIS — I10 Essential (primary) hypertension: Secondary | ICD-10-CM

## 2016-09-22 DIAGNOSIS — E785 Hyperlipidemia, unspecified: Secondary | ICD-10-CM | POA: Diagnosis not present

## 2016-09-22 MED ORDER — BENAZEPRIL HCL 40 MG PO TABS: 40.0000 mg | ORAL_TABLET | Freq: Every day | ORAL | 11 refills | Status: DC

## 2016-09-22 MED ORDER — BENAZEPRIL HCL 40 MG PO TABS: 40.0000 mg | ORAL_TABLET | Freq: Every day | ORAL | 11 refills | Status: AC

## 2016-09-22 NOTE — Patient Instructions (Signed)
Dr Sallyanne Kuster has recommended making the following medication changes: 1. INCREASE Benazepril to 40 mg daily  Remote monitoring is used to monitor your Pacemaker of ICD from home. This monitoring reduces the number of office visits required to check your device to one time per year. It allows [place] to keep an eye on the functioning of your device to ensure it is working properly. You are scheduled for a device check from home on Tuesday, Februaury 6th, 2018. You may send your transmission at any time that day. If you have a wireless device, the transmission will be sent automatically. After your physician reviews your transmission, you will receive a postcard with your next transmission date.  Dr Sallyanne Kuster recommends that you schedule a follow-up appointment in 12 months with a pacemaker check. You will receive a reminder letter in the mail two months in advance. If you don't receive a letter, please call our office to schedule the follow-up appointment.  If you need a refill on your cardiac medications before your next appointment, please call your pharmacy.     Low-Sodium Eating Plan Sodium raises blood pressure and causes water to be held in the body. Getting less sodium from food will help lower your blood pressure, reduce any swelling, and protect your heart, liver, and kidneys. We get sodium by adding salt (sodium chloride) to food. Most of our sodium comes from canned, boxed, and frozen foods. Restaurant foods, fast foods, and pizza are also very high in sodium. Even if you take medicine to lower your blood pressure or to reduce fluid in your body, getting less sodium from your food is important. WHAT IS MY PLAN? Most people should limit their sodium intake to 2,300 mg a day. Your health care provider recommends that you limit your sodium intake to __________ a day.  WHAT DO I NEED TO KNOW ABOUT THIS EATING PLAN? For the low-sodium eating plan, you will follow these general guidelines:  Choose  foods with a % Daily Value for sodium of less than 5% (as listed on the food label).   Use salt-free seasonings or herbs instead of table salt or sea salt.   Check with your health care provider or pharmacist before using salt substitutes.   Eat fresh foods.  Eat more vegetables and fruits.  Limit canned vegetables. If you do use them, rinse them well to decrease the sodium.   Limit cheese to 1 oz (28 g) per day.   Eat lower-sodium products, often labeled as "lower sodium" or "no salt added."  Avoid foods that contain monosodium glutamate (MSG). MSG is sometimes added to [place] food and some canned foods.  Check food labels (Nutrition Facts labels) on foods to learn how much sodium is in one serving.  Eat more home-cooked food and less restaurant, buffet, and fast food.  When eating at a restaurant, ask that your food be prepared with less salt, or no salt if possible.  HOW DO I READ FOOD LABELS FOR SODIUM INFORMATION? The Nutrition Facts label lists the amount of sodium in one serving of the food. If you eat more than one serving, you must multiply the listed amount of sodium by the number of servings. Food labels may also identify foods as:  Sodium free--Less than 5 mg in a serving.  Very low sodium--35 mg or less in a serving.  Low sodium--140 mg or less in a serving.  Light in sodium--50% less sodium in a serving. For example, if a food that usually has  300 mg of sodium is changed to become light in sodium, it will have 150 mg of sodium.  Reduced sodium--25% less sodium in a serving. For example, if a food that usually has 400 mg of sodium is changed to reduced sodium, it will have 300 mg of sodium. WHAT FOODS CAN I EAT? Grains Low-sodium cereals, including oats, puffed wheat and rice, and shredded wheat cereals. Low-sodium crackers. Unsalted rice and pasta. Lower-sodium bread.  Vegetables Frozen or fresh vegetables. Low-sodium or reduced-sodium canned  vegetables. Low-sodium or reduced-sodium tomato sauce and paste. Low-sodium or reduced-sodium tomato and vegetable juices.  Fruits Fresh, frozen, and canned fruit. Fruit juice.  Meat and Other Protein Products Low-sodium canned tuna and salmon. Fresh or frozen meat, poultry, seafood, and fish. Lamb. Unsalted nuts. Dried beans, peas, and lentils without added salt. Unsalted canned beans. Homemade soups without salt. Eggs.  Dairy Milk. Soy milk. Ricotta cheese. Low-sodium or reduced-sodium cheeses. Yogurt.  Condiments Fresh and dried herbs and spices. Salt-free seasonings. Onion and garlic powders. Low-sodium varieties of mustard and ketchup. Fresh or refrigerated horseradish. Lemon juice.  Fats and Oils Reduced-sodium salad dressings. Unsalted butter.  Other Unsalted popcorn and pretzels.  The items listed above may not be a complete list of recommended foods or beverages. Contact your dietitian for more options. WHAT FOODS ARE NOT RECOMMENDED? Grains Instant hot cereals. Bread stuffing, pancake, and biscuit mixes. Croutons. Seasoned rice or pasta mixes. Noodle soup cups. Boxed or frozen macaroni and cheese. Self-rising flour. Regular salted crackers. Vegetables Regular canned vegetables. Regular canned tomato sauce and paste. Regular tomato and vegetable juices. Frozen vegetables in sauces. Salted [place] fries. Olives. Angie Fava. Relishes. Sauerkraut. Salsa. Meat and Other Protein Products Salted, canned, smoked, spiced, or pickled meats, seafood, or fish. Bacon, ham, sausage, hot dogs, corned beef, chipped beef, and packaged luncheon meats. Salt pork. Jerky. Pickled herring. Anchovies, regular canned tuna, and sardines. Salted nuts. Dairy Processed cheese and cheese spreads. Cheese curds. Blue cheese and cottage cheese. Buttermilk.  Condiments Onion and garlic salt, seasoned salt, table salt, and sea salt. Canned and packaged gravies. Worcestershire sauce. Tartar sauce. Barbecue  sauce. Teriyaki sauce. Soy sauce, including reduced sodium. Steak sauce. Fish sauce. Oyster sauce. Cocktail sauce. Horseradish that you find on the shelf. Regular ketchup and mustard. Meat flavorings and tenderizers. Bouillon cubes. Hot sauce. Tabasco sauce. Marinades. Taco seasonings. Relishes. Fats and Oils Regular salad dressings. Salted butter. Margarine. Ghee. Bacon fat.  Other Potato and tortilla chips. Corn chips and puffs. Salted popcorn and pretzels. Canned or dried soups. Pizza. Frozen entrees and pot pies.  The items listed above may not be a complete list of foods and beverages to avoid. Contact your dietitian for more information.   This information is not intended to replace advice given to you by your health care provider. Make sure you discuss any questions you have with your health care provider.   Document Released: 04/24/2002 Document Revised: 11/23/2014 Document Reviewed: 09/06/2013 Elsevier Interactive Patient Education [employer].

## 2016-09-22 NOTE — Progress Notes (Signed)
Cardiology Office Note    Date:  09/22/2016   ID:  Kenneth Clay, DOB Nov 08, 1932, MRN [id number]  PCP:  Kenneth Argyle, MD  Cardiologist:  Kenneth Klein, MD   Chief Complaint  Patient presents with  . Follow-up    pt denied chest pain and SOB    History of Present Illness:  Kenneth Clay is a 82 y.o. male with complete heart block but without known structural heart disease returning for pacemaker follow-up. Since his last office appointment Kenneth Clay and his wife have moved to [employer] living, mostly due to her worsening dementia. He is much less active than he was in the past. He is to go to work out in [employer] twice a week. He has also been eating more since he has 3 square meals a day provided by the community. His wife craves ice cream every night and he has been indulging as well. His blood pressure is high. He has gained weight.  Pacemaker interrogation shows normal device function. His St. Jude Accent device implanted in 2014 (initial device 1998, changeouts in 2005 and 2014) has 7.7-8.8 years of longevity. He has 100% atrial pacing and 100% ventricular pacing. He has very rare episodes of paroxysmal atrial tachycardia to a maximum of 30 seconds in duration and he has not had any atrial fibrillation. There have been no episodes of ventricular tachycardia.  He takes been as appropriate for hypertension and atorvastatin for hyperlipidemia. He had a normal nuclear stress test in 2011 and has normal left ventricular systolic function. Although his dementia is much milder than his wife's, he also takes donepezil and memantine.    Past Medical History:  Diagnosis Date  . CHB (complete heart block) ([place]) 09/03/2015  . Dementia   . Depression   . H/O cardiovascular stress test 04/30/2010   Persantine myoview-normal  . H/O: GI bleed   . Hyperlipidemia   . Hypertension   . LV dysfunction    last echo-04/30/10, EF 50-55%, impaired LV relaxation  .  Pacemaker    new gen. last check with insert 01/03/13  . Sinus node dysfunction (HCC)   . Ulcer [facility])     Past Surgical History:  Procedure Laterality Date  . PACEMAKER GENERATOR CHANGE  2005   St. Jude Idenity ADxXL DR  . PACEMAKER GENERATOR CHANGE  01/03/2013   ST. Jude Accent DR RF  . PACEMAKER GENERATOR CHANGE N/A 01/03/2013   Procedure: PACEMAKER GENERATOR CHANGE;  Surgeon: Kenneth Klein, MD;  Location: [facility] CATH LAB;  Service: Cardiovascular;  Laterality: N/A;  . PACEMAKER INSERTION  1998  . ROTATOR CUFF REPAIR     bilateral  . sciatica     injection 06-2015  . US ECHOCARDIOGRAPHY  04/30/10   aortic root sclerotic,RV mildly dilated    Current Medications: Outpatient Medications Prior to Visit  Medication Sig Dispense Refill  . aspirin EC 81 MG tablet Take 81 mg by mouth daily.    Marland Kitchen atorvastatin (LIPITOR) 10 MG tablet Take 1 tablet (10 mg total) by mouth daily at 6 PM. 30 tablet 0  . citalopram (CELEXA) 10 MG tablet Take 0.5 tablets (5 mg total) by mouth daily. 15 tablet 1  . donepezil (ARICEPT) 10 MG tablet Take 1 tablet (10 mg total) by mouth at bedtime. 30 tablet 6  . memantine (NAMENDA) 10 MG tablet Take 1 tablet (10 mg total) by mouth 2 (two) times daily. 60 tablet 6  . pantoprazole (PROTONIX) 40 MG  tablet Take 40 mg by mouth daily.     . benazepril (LOTENSIN) 20 MG tablet Take 20 mg by mouth daily.      . Coconut Oil 1000 MG CAPS Take 1,000 mg by mouth daily.     . Omega-3 Fatty Acids (FISH OIL TRIPLE STRENGTH) 1400 MG CAPS Take 1,400 mg by mouth daily.     No facility-administered medications prior to visit.      Allergies:   Patient has no known allergies.   Social History   Social History  . Marital status: Married    Spouse name: Kenneth Clay  . Number of children: 3  . Years of education: N/A   Occupational History  . fire fighter-retired Retired   Social History Main Topics  . Smoking status: Former Smoker    Quit date: 11/16/2004  . Smokeless tobacco: Never  Used  . Alcohol use No  . Drug use: No  . Sexual activity: Not on file   Other Topics Concern  . Not on file   Social History Narrative  . No narrative on file     Family History:  The patient's family history includes Diabetes in his brother and mother; Heart disease in his father.   ROS:   Please see the history of present illness.    ROS All other systems reviewed and are negative.   PHYSICAL EXAM:   VS:  BP (!) 163/81   Pulse 75   Ht 5\' 6"  (1.676 m)   Wt 184 lb (83.5 kg)   BMI 29.70 kg/m    GEN: Well nourished, well developed, in no acute distress  HEENT: normal  Neck: no JVD, carotid bruits, or masses Cardiac: Her endoscopy split second heart sound RRR; no murmurs, rubs, or gallops,no edema  Respiratory:  clear to auscultation bilaterally, normal work of breathing GI: soft, nontender, nondistended, + BS MS: no deformity or atrophy  Skin: warm and dry, no rash Neuro:  Alert and Oriented x 3, Strength and sensation are intact Psych: euthymic mood, full affect  Wt Readings from Last 3 Encounters:  09/22/16 184 lb (83.5 kg)  03/20/16 183 lb 9.6 oz (83.3 kg)  01/13/16 175 lb 14.8 oz (79.8 kg)      Studies/Labs Reviewed:   EKG:  EKG is ordered today.  The ekg ordered today demonstrates AV sequential pacing  Recent Labs: 01/12/2016: B Natriuretic Peptide 55.5 01/13/2016: ALT 38; BUN 14; Creatinine, Ser 0.90; Hemoglobin 13.4; Platelets 337; Potassium 3.4; Sodium 140   Lipid Panel    Component Value Date/Time   CHOL 163 01/13/2016 0443   TRIG 120 01/13/2016 0443   HDL 22 (L) 01/13/2016 0443   CHOLHDL 7.4 01/13/2016 0443   VLDL 24 01/13/2016 0443   LDLCALC 117 (H) 01/13/2016 0443      ASSESSMENT:    1. 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 CHB (complete heart block) (HCC)      PLAN:  In order of problems listed above:  1. CHB: Pacemaker dependent.  2. PPM: Normal pacemaker function. Remote downloads  every 3 months and office visit yearly 3. HTN: Increase benazepril to 40 mg daily, but he has to try to reverse the trend towards a sedentary lifestyle and weight gain. Asked him to stop eating ice cream at night, reduced intake of sugars and starches in general, start exercising twice weekly at least. 4. HLP: His LDL cholesterol is above target and his HDL is very low. I think  the measure described above should help both parameters returned to desirable range.    Medication Adjustments/Labs and Tests Ordered: Current medicines are reviewed at length with the patient today.  Concerns regarding medicines are outlined above.  Medication changes, Labs and Tests ordered today are listed in the Patient Instructions below. Patient Instructions  Dr Sallyanne Kuster has recommended making the following medication changes: 1. INCREASE Benazepril to 40 mg daily  Remote monitoring is used to monitor your Pacemaker of ICD from home. This monitoring reduces the number of office visits required to check your device to one time per year. It allows [place] to keep an eye on the functioning of your device to ensure it is working properly. You are scheduled for a device check from home on Tuesday, Februaury 6th, 2018. You may send your transmission at any time that day. If you have a wireless device, the transmission will be sent automatically. After your physician reviews your transmission, you will receive a postcard with your next transmission date.  Dr Sallyanne Kuster recommends that you schedule a follow-up appointment in 12 months with a pacemaker check. You will receive a reminder letter in the mail two months in advance. If you don't receive a letter, please call our office to schedule the follow-up appointment.  If you need a refill on your cardiac medications before your next appointment, please call your pharmacy.     Low-Sodium Eating Plan Sodium raises blood pressure and causes water to be held in the body. Getting  less sodium from food will help lower your blood pressure, reduce any swelling, and protect your heart, liver, and kidneys. We get sodium by adding salt (sodium chloride) to food. Most of our sodium comes from canned, boxed, and frozen foods. Restaurant foods, fast foods, and pizza are also very high in sodium. Even if you take medicine to lower your blood pressure or to reduce fluid in your body, getting less sodium from your food is important. WHAT IS MY PLAN? Most people should limit their sodium intake to 2,300 mg a day. Your health care provider recommends that you limit your sodium intake to __________ a day.  WHAT DO I NEED TO KNOW ABOUT THIS EATING PLAN? For the low-sodium eating plan, you will follow these general guidelines:  Choose foods with a % Daily Value for sodium of less than 5% (as listed on the food label).   Use salt-free seasonings or herbs instead of table salt or sea salt.   Check with your health care provider or pharmacist before using salt substitutes.   Eat fresh foods.  Eat more vegetables and fruits.  Limit canned vegetables. If you do use them, rinse them well to decrease the sodium.   Limit cheese to 1 oz (28 g) per day.   Eat lower-sodium products, often labeled as "lower sodium" or "no salt added."  Avoid foods that contain monosodium glutamate (MSG). MSG is sometimes added to [place] food and some canned foods.  Check food labels (Nutrition Facts labels) on foods to learn how much sodium is in one serving.  Eat more home-cooked food and less restaurant, buffet, and fast food.  When eating at a restaurant, ask that your food be prepared with less salt, or no salt if possible.  HOW DO I READ FOOD LABELS FOR SODIUM INFORMATION? The Nutrition Facts label lists the amount of sodium in one serving of the food. If you eat more than one serving, you must multiply the listed amount of sodium by the  number of servings. Food labels may also identify  foods as:  Sodium free--Less than 5 mg in a serving.  Very low sodium--35 mg or less in a serving.  Low sodium--140 mg or less in a serving.  Light in sodium--50% less sodium in a serving. For example, if a food that usually has 300 mg of sodium is changed to become light in sodium, it will have 150 mg of sodium.  Reduced sodium--25% less sodium in a serving. For example, if a food that usually has 400 mg of sodium is changed to reduced sodium, it will have 300 mg of sodium. WHAT FOODS CAN I EAT? Grains Low-sodium cereals, including oats, puffed wheat and rice, and shredded wheat cereals. Low-sodium crackers. Unsalted rice and pasta. Lower-sodium bread.  Vegetables Frozen or fresh vegetables. Low-sodium or reduced-sodium canned vegetables. Low-sodium or reduced-sodium tomato sauce and paste. Low-sodium or reduced-sodium tomato and vegetable juices.  Fruits Fresh, frozen, and canned fruit. Fruit juice.  Meat and Other Protein Products Low-sodium canned tuna and salmon. Fresh or frozen meat, poultry, seafood, and fish. Lamb. Unsalted nuts. Dried beans, peas, and lentils without added salt. Unsalted canned beans. Homemade soups without salt. Eggs.  Dairy Milk. Soy milk. Ricotta cheese. Low-sodium or reduced-sodium cheeses. Yogurt.  Condiments Fresh and dried herbs and spices. Salt-free seasonings. Onion and garlic powders. Low-sodium varieties of mustard and ketchup. Fresh or refrigerated horseradish. Lemon juice.  Fats and Oils Reduced-sodium salad dressings. Unsalted butter.  Other Unsalted popcorn and pretzels.  The items listed above may not be a complete list of recommended foods or beverages. Contact your dietitian for more options. WHAT FOODS ARE NOT RECOMMENDED? Grains Instant hot cereals. Bread stuffing, pancake, and biscuit mixes. Croutons. Seasoned rice or pasta mixes. Noodle soup cups. Boxed or frozen macaroni and cheese. Self-rising flour. Regular salted  crackers. Vegetables Regular canned vegetables. Regular canned tomato sauce and paste. Regular tomato and vegetable juices. Frozen vegetables in sauces. Salted [place] fries. Olives. Angie Fava. Relishes. Sauerkraut. Salsa. Meat and Other Protein Products Salted, canned, smoked, spiced, or pickled meats, seafood, or fish. Bacon, ham, sausage, hot dogs, corned beef, chipped beef, and packaged luncheon meats. Salt pork. Jerky. Pickled herring. Anchovies, regular canned tuna, and sardines. Salted nuts. Dairy Processed cheese and cheese spreads. Cheese curds. Blue cheese and cottage cheese. Buttermilk.  Condiments Onion and garlic salt, seasoned salt, table salt, and sea salt. Canned and packaged gravies. Worcestershire sauce. Tartar sauce. Barbecue sauce. Teriyaki sauce. Soy sauce, including reduced sodium. Steak sauce. Fish sauce. Oyster sauce. Cocktail sauce. Horseradish that you find on the shelf. Regular ketchup and mustard. Meat flavorings and tenderizers. Bouillon cubes. Hot sauce. Tabasco sauce. Marinades. Taco seasonings. Relishes. Fats and Oils Regular salad dressings. Salted butter. Margarine. Ghee. Bacon fat.  Other Potato and tortilla chips. Corn chips and puffs. Salted popcorn and pretzels. Canned or dried soups. Pizza. Frozen entrees and pot pies.  The items listed above may not be a complete list of foods and beverages to avoid. Contact your dietitian for more information.   This information is not intended to replace advice given to you by your health care provider. Make sure you discuss any questions you have with your health care provider.   Document Released: 04/24/2002 Document Revised: 11/23/2014 Document Reviewed: 09/06/2013 Elsevier Interactive Patient Education 2016 Sh[place], Kenneth Klein, MD  09/22/2016 4:29 PM    [address], [place], [place]  [address] Phone: [phone]; Fax: [phone]   

## 2016-09-23 ENCOUNTER — Encounter: Payer: Medicare Other | Admitting: Cardiovascular Disease

## 2016-10-28 ENCOUNTER — Telehealth: Payer: Self-pay | Admitting: Cardiovascular Disease

## 2016-10-28 NOTE — Telephone Encounter (Signed)
Wants to know if transmission are coming thru and is monitor doing like it is suppose to Another question would he be charged if they do another test.

## 2016-10-28 NOTE — Telephone Encounter (Signed)
LMOVM for pt to return call 

## 2016-10-28 NOTE — Telephone Encounter (Signed)
Spoke w/ pt daughter and she wanted to know if she could send a remote transmission and the pt not be charged. Pt recently moved rooms at the facility he leaves at and she isn't sure if it is working properly. Informed pt daughter that she can send a transmission and the pt will not be charged. Instructed pt how to send a remote transmission. She verbalized understanding.

## 2016-12-05 LAB — CUP PACEART INCLINIC DEVICE CHECK
Date Time Interrogation Session: 20180120141920
Implantable Lead Implant Date: 19980305
Implantable Lead Location: 753859
Implantable Pulse Generator Implant Date: 20140218
MDC IDC LEAD IMPLANT DT: 19980305
MDC IDC LEAD LOCATION: 753860
MDC IDC PG SERIAL: 7448063
Pulse Gen Model: 2210

## 2016-12-22 ENCOUNTER — Telehealth: Payer: Self-pay | Admitting: Cardiology

## 2016-12-22 ENCOUNTER — Ambulatory Visit (INDEPENDENT_AMBULATORY_CARE_PROVIDER_SITE_OTHER): Payer: Medicare Other | Admitting: *Deleted

## 2016-12-22 DIAGNOSIS — I442 Atrioventricular block, complete: Secondary | ICD-10-CM

## 2016-12-22 NOTE — Telephone Encounter (Signed)
Confirmed remote transmission w/ pt daughter.   

## 2016-12-23 NOTE — Progress Notes (Signed)
Remote pacemaker transmission.   

## 2016-12-25 ENCOUNTER — Encounter: Payer: Self-pay | Admitting: Cardiology

## 2016-12-25 LAB — CUP PACEART REMOTE DEVICE CHECK
Battery Remaining Percentage: 91 %
Battery Voltage: 2.95 V
Brady Statistic AP VP Percent: 99 %
Brady Statistic AS VP Percent: 1 %
Brady Statistic AS VS Percent: 1 %
Brady Statistic RA Percent Paced: 99 %
Brady Statistic RV Percent Paced: 99 %
Date Time Interrogation Session: 20180206213150
Implantable Lead Implant Date: 19980305
Implantable Lead Implant Date: 19980305
Implantable Lead Location: 753859
Implantable Pulse Generator Implant Date: 20140218
Lead Channel Impedance Value: 560 Ohm
Lead Channel Pacing Threshold Amplitude: 0.75 V
Lead Channel Pacing Threshold Pulse Width: 0.4 ms
Lead Channel Sensing Intrinsic Amplitude: 12 mV
Lead Channel Sensing Intrinsic Amplitude: 3.9 mV
Lead Channel Setting Pacing Amplitude: 1.625
Lead Channel Setting Sensing Sensitivity: 2 mV
MDC IDC LEAD LOCATION: 753860
MDC IDC MSMT BATTERY REMAINING LONGEVITY: 98 mo
MDC IDC MSMT LEADCHNL RA IMPEDANCE VALUE: 450 Ohm
MDC IDC MSMT LEADCHNL RA PACING THRESHOLD PULSEWIDTH: 0.4 ms
MDC IDC MSMT LEADCHNL RV PACING THRESHOLD AMPLITUDE: 1.375 V
MDC IDC SET LEADCHNL RA PACING AMPLITUDE: 2 V
MDC IDC SET LEADCHNL RV PACING PULSEWIDTH: 0.4 ms
MDC IDC STAT BRADY AP VS PERCENT: 1 %
Pulse Gen Model: 2210
Pulse Gen Serial Number: 7448063

## 2017-03-23 ENCOUNTER — Ambulatory Visit (INDEPENDENT_AMBULATORY_CARE_PROVIDER_SITE_OTHER): Payer: Medicare Other | Admitting: *Deleted

## 2017-03-23 DIAGNOSIS — I442 Atrioventricular block, complete: Secondary | ICD-10-CM

## 2017-03-23 NOTE — Progress Notes (Signed)
Remote pacemaker transmission.   

## 2017-03-25 LAB — CUP PACEART REMOTE DEVICE CHECK
Battery Remaining Longevity: 97 mo
Battery Remaining Percentage: 91 %
Battery Voltage: 2.95 V
Brady Statistic AP VP Percent: 99 %
Brady Statistic AP VS Percent: 1 %
Brady Statistic AS VP Percent: 1 %
Brady Statistic RA Percent Paced: 99 %
Brady Statistic RV Percent Paced: 99 %
Implantable Lead Implant Date: 19980305
Implantable Lead Implant Date: 19980305
Implantable Lead Location: 753859
Implantable Lead Location: 753860
Implantable Pulse Generator Implant Date: 20140218
Lead Channel Impedance Value: 450 Ohm
Lead Channel Impedance Value: 580 Ohm
Lead Channel Pacing Threshold Amplitude: 0.75 V
Lead Channel Pacing Threshold Amplitude: 1.375 V
Lead Channel Pacing Threshold Pulse Width: 0.4 ms
Lead Channel Sensing Intrinsic Amplitude: 4.3 mV
Lead Channel Setting Pacing Pulse Width: 0.4 ms
Lead Channel Setting Sensing Sensitivity: 2 mV
MDC IDC MSMT LEADCHNL RV PACING THRESHOLD PULSEWIDTH: 0.4 ms
MDC IDC MSMT LEADCHNL RV SENSING INTR AMPL: 12 mV
MDC IDC PG SERIAL: 7448063
MDC IDC SESS DTM: 20180508060009
MDC IDC SET LEADCHNL RA PACING AMPLITUDE: 2 V
MDC IDC SET LEADCHNL RV PACING AMPLITUDE: 1.625
MDC IDC STAT BRADY AS VS PERCENT: 1 %

## 2017-04-27 ENCOUNTER — Other Ambulatory Visit: Payer: Self-pay | Admitting: Cardiovascular Disease

## 2017-06-22 ENCOUNTER — Ambulatory Visit (INDEPENDENT_AMBULATORY_CARE_PROVIDER_SITE_OTHER): Payer: Medicare Other | Admitting: *Deleted

## 2017-06-22 DIAGNOSIS — I442 Atrioventricular block, complete: Secondary | ICD-10-CM

## 2017-06-22 NOTE — Progress Notes (Signed)
Remote pacemaker transmission.   

## 2017-06-23 ENCOUNTER — Encounter: Payer: Self-pay | Admitting: Cardiology

## 2017-06-28 ENCOUNTER — Ambulatory Visit
Admission: RE | Admit: 2017-06-28 | Discharge: 2017-06-28 | Disposition: A | Discharge status: Home / Self Care | Payer: Medicare Other | Source: Ambulatory Visit | Attending: Geriatric Medicine | Admitting: Geriatric Medicine

## 2017-06-28 ENCOUNTER — Other Ambulatory Visit: Payer: Self-pay | Admitting: Geriatric Medicine

## 2017-06-28 DIAGNOSIS — J181 Lobar pneumonia, unspecified organism: Principal | ICD-10-CM

## 2017-06-28 DIAGNOSIS — J189 Pneumonia, unspecified organism: Secondary | ICD-10-CM

## 2017-06-29 LAB — CUP PACEART REMOTE DEVICE CHECK
Battery Remaining Percentage: 81 %
Battery Voltage: 2.93 V
Brady Statistic AP VP Percent: 99 %
Brady Statistic AP VS Percent: 1 %
Brady Statistic AS VS Percent: 1 %
Brady Statistic RV Percent Paced: 99 %
Date Time Interrogation Session: 20180807060010
Implantable Lead Implant Date: 19980305
Implantable Lead Implant Date: 19980305
Implantable Lead Location: 753859
Implantable Pulse Generator Implant Date: 20140218
Lead Channel Impedance Value: 560 Ohm
Lead Channel Pacing Threshold Amplitude: 1.375 V
Lead Channel Pacing Threshold Pulse Width: 0.4 ms
Lead Channel Sensing Intrinsic Amplitude: 4.7 mV
Lead Channel Setting Pacing Amplitude: 1.625
Lead Channel Setting Pacing Pulse Width: 0.4 ms
Lead Channel Setting Sensing Sensitivity: 2 mV
MDC IDC LEAD LOCATION: 753860
MDC IDC MSMT BATTERY REMAINING LONGEVITY: 87 mo
MDC IDC MSMT LEADCHNL RA IMPEDANCE VALUE: 450 Ohm
MDC IDC MSMT LEADCHNL RA PACING THRESHOLD AMPLITUDE: 0.75 V
MDC IDC MSMT LEADCHNL RA PACING THRESHOLD PULSEWIDTH: 0.4 ms
MDC IDC MSMT LEADCHNL RV SENSING INTR AMPL: 12 mV
MDC IDC SET LEADCHNL RA PACING AMPLITUDE: 2 V
MDC IDC STAT BRADY AS VP PERCENT: 1 %
MDC IDC STAT BRADY RA PERCENT PACED: 99 %
Pulse Gen Model: 2210
Pulse Gen Serial Number: 7448063

## 2017-07-20 ENCOUNTER — Telehealth: Payer: Self-pay

## 2017-07-20 NOTE — Telephone Encounter (Signed)
Spoke with patient's daughter who states that her dad stated that his device "let him know to sit down". She was unsure of what that meant to him stating that he has dementia. She states that he has been fatigued and she noticed he has been ShOB. She will send a manual transmission from his home monitor when they get home this afternoon. Direct number given to patient to call back after they have sent a transmission.

## 2017-07-20 NOTE — Telephone Encounter (Signed)
Returned call to patient's daughter Helene Kelp she stated father has not felt good today.No energy.Stated he is just not his self.Stated PM was checked today and is ok.Stated she would like him to be seen.Appointment scheduled with Truitt Merle NP 07/21/17 at 11:30 am at [facility] office.

## 2017-07-21 ENCOUNTER — Encounter: Payer: Self-pay | Admitting: Nurse Practitioner

## 2017-07-21 ENCOUNTER — Ambulatory Visit (INDEPENDENT_AMBULATORY_CARE_PROVIDER_SITE_OTHER): Payer: Medicare Other | Admitting: Nurse Practitioner

## 2017-07-21 VITALS — BP 170/78 | HR 70 | Wt 186.4 lb

## 2017-07-21 DIAGNOSIS — R531 Weakness: Secondary | ICD-10-CM

## 2017-07-21 DIAGNOSIS — I10 Essential (primary) hypertension: Secondary | ICD-10-CM

## 2017-07-21 DIAGNOSIS — I495 Sick sinus syndrome: Secondary | ICD-10-CM | POA: Diagnosis not present

## 2017-07-21 DIAGNOSIS — Z950 Presence of cardiac pacemaker: Secondary | ICD-10-CM | POA: Diagnosis not present

## 2017-07-21 NOTE — Progress Notes (Signed)
Thanks, Cecille Rubin. MCr

## 2017-07-21 NOTE — Patient Instructions (Signed)
We will be checking the following labs today - NONE   Medication Instructions:    Continue with your current medicines.     Testing/Procedures To Be Arranged:  N/A  Follow-Up:   See Dr. Sallyanne Kuster in November.     Other Special Instructions:   N/A    If you need a refill on your cardiac medications before your next appointment, please call your pharmacy.   Call the [place] office at [phone] if you have any questions, problems or concerns.

## 2017-07-21 NOTE — Progress Notes (Signed)
CARDIOLOGY OFFICE NOTE  Date:  07/21/2017    Kenneth Clay Date of Birth: 1932-10-18 Medical Record #[id number]  PCP:  Lajean Manes, MD  Cardiologist:  Cro[place]  Chief Complaint  Patient presents with  . Fatigue    Work in visit - seen for Dr. Sallyanne Kuster    History of Present Illness: Kenneth Clay is a 83 y.o. male who presents today for a work in visit. Seen for Dr. Sallyanne Kuster.   He has known complete heart block with underlying PPM in place and without known structural heart disease. He is at assisted living due to wife's progressive dementia. Other issues include HTN and HLD. He had a normal nuclear stress test in 2011 and has normal left ventricular systolic function  Last seen by Dr. Sallyanne Kuster back in November - noted to be less active. Otherwise seemed to be doing ok. PPM check was ok.  Phone call noted yesterday - was not feeling well. Transmitted his device - this was ok.   Comes in today. Here with his daughter - I have seen her come with her aunt who has dementia. [place] and was doing more this past weekend in the way of activity - more than normal. He thought the device "told him when to tell him to sit down". No chest pain. Feels better today and is back to his baseline today.  BP has been running high - has been added on low dose Norvasc. Has been on this for about 3 weeks. Probably does get too much salt. He has no real concerns today.   Past Medical History:  Diagnosis Date  . CHB (complete heart block) ([place]) 09/03/2015  . Dementia   . Depression   . H/O cardiovascular stress test 04/30/2010   Persantine myoview-normal  . H/O: GI bleed   . Hyperlipidemia   . Hypertension   . LV dysfunction    last echo-04/30/10, EF 50-55%, impaired LV relaxation  . Pacemaker    new gen. last check with insert 01/03/13  . Sinus node dysfunction (HCC)   . Ulcer     Past Surgical History:    Procedure Laterality Date  . PACEMAKER GENERATOR CHANGE  2005   St. Jude Idenity ADxXL DR  . PACEMAKER GENERATOR CHANGE  01/03/2013   ST. Jude Accent DR RF  . PACEMAKER GENERATOR CHANGE N/A 01/03/2013   Procedure: PACEMAKER GENERATOR CHANGE;  Surgeon: Sanda Klein, MD;  Location: [facility] CATH LAB;  Service: Cardiovascular;  Laterality: N/A;  . PACEMAKER INSERTION  1998  . ROTATOR CUFF REPAIR     bilateral  . sciatica     injection 06-2015  . US ECHOCARDIOGRAPHY  04/30/10   aortic root sclerotic,RV mildly dilated     Medications: Current Meds  Medication Sig  . amLODipine (NORVASC) 2.5 MG tablet Take 2.5 mg by mouth daily.  Marland Kitchen aspirin EC 81 MG tablet Take 81 mg by mouth daily.  Marland Kitchen atorvastatin (LIPITOR) 10 MG tablet Take 1 tablet (10 mg total) by mouth daily at 6 PM.  . benazepril (LOTENSIN) 40 MG tablet Take 1 tablet (40 mg total) by mouth daily.  . busPIRone (BUSPAR) 5 MG tablet Take 5 mg by mouth 2 (two) times daily.   . citalopram (CELEXA) 10 MG tablet Take 0.5 tablets (5 mg total) by mouth daily.  Marland Kitchen donepezil (ARICEPT) 10 MG tablet Take 1 tablet (10 mg  total) by mouth at bedtime.  . memantine (NAMENDA) 10 MG tablet Take 1 tablet (10 mg total) by mouth 2 (two) times daily.  . pantoprazole (PROTONIX) 40 MG tablet Take 40 mg by mouth daily.      Allergies: No Known Allergies  Social History: The patient  reports that he quit smoking about 12 years ago. He has never used smokeless tobacco. He reports that he does not drink alcohol or use drugs.   Family History: The patient's family history includes Diabetes in his brother and mother; Heart disease in his father.   Review of Systems: Please see the history of present illness.   Otherwise, the review of systems is positive for none.   All other systems are reviewed and negative.   Physical Exam: VS:  BP (!) 170/78 (BP Location: Left Arm, Patient Position: Sitting, Cuff Size: Normal)   Pulse 70   Wt 186 lb 6.4 oz (84.6 kg)    SpO2 94%   BMI 30.09 kg/m  .  BMI Body mass index is 30.09 kg/m.  Wt Readings from Last 3 Encounters:  07/21/17 186 lb 6.4 oz (84.6 kg)  09/22/16 184 lb (83.5 kg)  03/20/16 183 lb 9.6 oz (83.3 kg)   BP recheck by me is 140/80.   General: Pleasant. Well developed, well nourished and in no acute distress.   HEENT: Normal.  Neck: Supple, no JVD, carotid bruits, or masses noted.  Cardiac: Regular rate and rhythm. No murmurs, rubs, or gallops. No edema.  Respiratory:  Lungs are clear to auscultation bilaterally with normal work of breathing.  GI: Soft and nontender.  MS: No deformity or atrophy. Gait and ROM intact.  Skin: Warm and dry. Color is normal.  Neuro:  Strength and sensation are intact and no gross focal deficits noted.  Psych: Alert, appropriate and with normal affect.   LABORATORY DATA:  EKG:  EKG is not ordered today.  Lab Results  Component Value Date   WBC 10.8 (H) 01/13/2016   HGB 13.4 01/13/2016   HCT 40.6 01/13/2016   PLT 337 01/13/2016   GLUCOSE 215 (H) 01/13/2016   CHOL 163 01/13/2016   TRIG 120 01/13/2016   HDL 22 (L) 01/13/2016   LDLCALC 117 (H) 01/13/2016   ALT 38 01/13/2016   AST 38 01/13/2016   NA 140 01/13/2016   K 3.4 (L) 01/13/2016   CL 108 01/13/2016   CREATININE 0.90 01/13/2016   BUN 14 01/13/2016   CO2 21 (L) 01/13/2016   INR 1.36 01/12/2016   HGBA1C 5.9 (H) 01/13/2016     BNP (last 3 results) No results for input(s): BNP in the last 8760 hours.  ProBNP (last 3 results) No results for input(s): PROBNP in the last 8760 hours.   Other Studies Reviewed Today:   Assessment/Plan:  1. Weak spell - most likely got overheated - probably a little dehydrated. His PPM check was fine yesterday as noted by the device clinic.   2. HTN - has had low dose Norvasc added - recheck by me looks better. I have left him on his current regimen for now.   3. PPM - to see Dr. Sallyanne Kuster in November - appointment made.   Current medicines are  reviewed with the patient today.  The patient does not have concerns regarding medicines other than what has been noted above.  The following changes have been made:  See above.  Labs/ tests ordered today include:   No orders of the defined types were  placed in this encounter.    Disposition:   FU with Dr. Sallyanne Kuster in November. I will be happy to see back if needed.     Patient is agreeable to this plan and will call if any problems develop in the interim.   SignedTruitt Merle, NP  07/21/2017 12:26 PM  [facility] [address] [place], [place]  [address] Phone: [phone] Fax: [phone]

## 2017-09-21 ENCOUNTER — Ambulatory Visit (INDEPENDENT_AMBULATORY_CARE_PROVIDER_SITE_OTHER): Payer: Medicare Other | Admitting: *Deleted

## 2017-09-21 DIAGNOSIS — I495 Sick sinus syndrome: Secondary | ICD-10-CM

## 2017-09-21 NOTE — Progress Notes (Signed)
Remote pacemaker transmission.   

## 2017-09-23 ENCOUNTER — Encounter: Payer: Self-pay | Admitting: Cardiology

## 2017-10-06 LAB — CUP PACEART REMOTE DEVICE CHECK
Battery Remaining Longevity: 87 mo
Battery Remaining Percentage: 81 %
Battery Voltage: 2.93 V
Brady Statistic AP VP Percent: 99 %
Brady Statistic AP VS Percent: 1 %
Brady Statistic AS VS Percent: 1 %
Brady Statistic RV Percent Paced: 99 %
Implantable Lead Implant Date: 19980305
Implantable Pulse Generator Implant Date: 20140218
Lead Channel Impedance Value: 440 Ohm
Lead Channel Impedance Value: 540 Ohm
Lead Channel Pacing Threshold Amplitude: 0.75 V
Lead Channel Pacing Threshold Pulse Width: 0.4 ms
Lead Channel Sensing Intrinsic Amplitude: 5 mV
Lead Channel Setting Sensing Sensitivity: 2 mV
MDC IDC LEAD IMPLANT DT: 19980305
MDC IDC LEAD LOCATION: 753859
MDC IDC LEAD LOCATION: 753860
MDC IDC MSMT LEADCHNL RA PACING THRESHOLD PULSEWIDTH: 0.4 ms
MDC IDC MSMT LEADCHNL RV PACING THRESHOLD AMPLITUDE: 1.5 V
MDC IDC MSMT LEADCHNL RV SENSING INTR AMPL: 12 mV
MDC IDC PG SERIAL: 7448063
MDC IDC SESS DTM: 20181106071543
MDC IDC SET LEADCHNL RA PACING AMPLITUDE: 2 V
MDC IDC SET LEADCHNL RV PACING AMPLITUDE: 1.75 V
MDC IDC SET LEADCHNL RV PACING PULSEWIDTH: 0.4 ms
MDC IDC STAT BRADY AS VP PERCENT: 1 %
MDC IDC STAT BRADY RA PERCENT PACED: 99 %

## 2017-10-19 ENCOUNTER — Ambulatory Visit: Payer: Medicare Other | Admitting: Cardiovascular Disease

## 2017-10-19 ENCOUNTER — Encounter: Payer: Self-pay | Admitting: Cardiovascular Disease

## 2017-10-19 VITALS — BP 136/72 | HR 70 | Ht 66.0 in | Wt 192.0 lb

## 2017-10-19 DIAGNOSIS — E785 Hyperlipidemia, unspecified: Secondary | ICD-10-CM

## 2017-10-19 DIAGNOSIS — I10 Essential (primary) hypertension: Secondary | ICD-10-CM

## 2017-10-19 DIAGNOSIS — I442 Atrioventricular block, complete: Secondary | ICD-10-CM | POA: Diagnosis not present

## 2017-10-19 DIAGNOSIS — I498 Other specified cardiac arrhythmias: Secondary | ICD-10-CM

## 2017-10-19 DIAGNOSIS — Z950 Presence of cardiac pacemaker: Secondary | ICD-10-CM | POA: Diagnosis not present

## 2017-10-19 NOTE — Patient Instructions (Signed)

## 2017-10-19 NOTE — Progress Notes (Signed)
Cardiology Office Note    Date:  10/19/2017   ID:  Kenneth Clay, DOB [birth year], MRN [id number]  PCP:  Kenneth Manes, MD  Cardiologist:  Kenneth Klein, MD   Chief Complaint  Patient presents with  . Follow-up    History of Present Illness:  Kenneth Clay is a 83 y.o. male with complete heart block but without known structural heart disease returning for pacemaker follow-up.   Pacemaker check performed last month showed he is [address] device still has roughly 6-7.5 years of battery longevity.  He has 100% atrial pacing in the 100% ventricular pacing with occasional brief episodes of mode switch all episodes of brief paroxysmal atrial tachycardia to 8 seconds in duration.  Heart rate histograms are blunted, but he is less active since moving to a senior living center.  He continues to gain weight.  Blood pressure medications had to be adjusted last fall, blood pressure today is acceptable. His wife, Kenneth Clay is now in a nursing home at [facility], due to dementia progression and he is living alone at home with caregivers during most of the day.  He takes been as appropriate for hypertension and atorvastatin for hyperlipidemia. He had a normal nuclear stress test in 2011 and has normal left ventricular systolic function. Although his dementia is much milder than his wife's, he also takes donepezil and memantine.   Past Medical History:  Diagnosis Date  . CHB (complete heart block) ([place]) 09/03/2015  . Dementia   . Depression   . H/O cardiovascular stress test 04/30/2010   Persantine myoview-normal  . H/O: GI bleed   . Hyperlipidemia   . Hypertension   . LV dysfunction    last echo-04/30/10, EF 50-55%, impaired LV relaxation  . Pacemaker    new gen. last check with insert 01/03/13  . Sinus node dysfunction (HCC)   . Ulcer     Past Surgical History:  Procedure Laterality Date  . PACEMAKER GENERATOR CHANGE  2005   St. Jude Idenity ADxXL DR  .  PACEMAKER GENERATOR CHANGE  01/03/2013   ST. Jude Accent DR RF  . PACEMAKER GENERATOR CHANGE N/A 01/03/2013   Procedure: PACEMAKER GENERATOR CHANGE;  Surgeon: Kenneth Klein, MD;  Location: [facility]ATH [facility];  Service: Cardiovascular;  Laterality: N/A;  . PACEMAKER INSERTION  1998  . ROTATOR CUFF REPAIR     bilateral  . sciatica     injection 06-2015  . US ECHOCARDIOGRAPHY  04/30/10   aortic root sclerotic,RV mildly dilated    Current Medications: Outpatient Medications Prior to Visit  Medication Sig Dispense Refill  . amLODipine (NORVASC) 2.5 MG tablet Take 2.5 mg by mouth daily.    . Ascorbic Acid (VITAMIN C) 1000 MG tablet Take 1,000 mg by mouth daily.    Marland Kitchen aspirin EC 81 MG tablet Take 81 mg by mouth daily.    Marland Kitchen atorvastatin (LIPITOR) 10 MG tablet Take 1 tablet (10 mg total) by mouth daily at 6 PM. 30 tablet 0  . benazepril (LOTENSIN) 40 MG tablet Take 1 tablet (40 mg total) by mouth daily. 30 tablet 11  . busPIRone (BUSPAR) 5 MG tablet Take 5 mg by mouth 2 (two) times daily.     . citalopram (CELEXA) 10 MG tablet Take 0.5 tablets (5 mg total) by mouth daily. 15 tablet 1  . donepezil (ARICEPT) 10 MG tablet Take 1 tablet (10 mg total) by mouth at bedtime. 30 tablet 6  . memantine (NAMENDA) 10  MG tablet Take 1 tablet (10 mg total) by mouth 2 (two) times daily. 60 tablet 6  . pantoprazole (PROTONIX) 40 MG tablet Take 40 mg by mouth daily.      No facility-administered medications prior to visit.      Allergies:   Patient has no known allergies.   Social History   Socioeconomic History  . Marital status: Married    Spouse name: Kenneth Clay  . Number of children: 3  . Years of education: None  . Highest education level: None  Social Needs  . Financial resource strain: None  . Food insecurity - worry: None  . Food insecurity - inability: None  . Transportation needs - medical: None  . Transportation needs - non-medical: None  Occupational History  . Occupation: Photographer: RETIRED  Tobacco Use  . Smoking status: Former Smoker    Last attempt to quit: 11/16/2004    Years since quitting: 12.9  . Smokeless tobacco: Never Used  Substance and Sexual Activity  . Alcohol use: No  . Drug use: No  . Sexual activity: None  Other Topics Concern  . None  Social History Narrative  . None     Family History:  The patient's family history includes Diabetes in his brother and mother; Heart disease in his father.   ROS:   Please see the history of present illness.    Review of Systems  Respiratory: Negative for sleep disturbances due to breathing.    All other systems reviewed and are negative.   PHYSICAL EXAM:   VS:  BP 136/72   Pulse 70   Ht 5\' 6"  (1.676 m)   Wt 192 lb (87.1 kg)   BMI 30.99 kg/m    GEN: Well nourished, well developed, in no acute distress  HEENT: normal  Neck: no JVD, carotid bruits, or masses Cardiac: Her endoscopy split second heart sound RRR; no murmurs, rubs, or gallops,no edema  Respiratory:  clear to auscultation bilaterally, normal work of breathing GI: soft, nontender, nondistended, + BS MS: no deformity or atrophy  Skin: warm and dry, no rash Neuro:  Alert and Oriented x 3, Strength and sensation are intact Psych: euthymic mood, full affect  Wt Readings from Last 3 Encounters:  10/19/17 192 lb (87.1 kg)  07/21/17 186 lb 6.4 oz (84.6 kg)  09/22/16 184 lb (83.5 kg)      Studies/Labs Reviewed:   EKG:  EKG is ordered today.  The ekg ordered today demonstrates AV sequential pacing  Recent Labs: No results found for requested labs within last 8760 hours.   Lipid Panel    Component Value Date/Time   CHOL 163 01/13/2016 0443   TRIG 120 01/13/2016 0443   HDL 22 (L) 01/13/2016 0443   CHOLHDL 7.4 01/13/2016 0443   VLDL 24 01/13/2016 0443   LDLCALC 117 (H) 01/13/2016 0443    August 19, 2017 total cholesterol 123, HDL 46, LDL 50, triglycerides 131, fasting glucose 101, creatinine 0.84, potassium 3.9, normal  liver function tests  ASSESSMENT:    1. CHB (complete heart block) (HCC)   2. 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 Essential hypertension   4. Dyslipidemia      PLAN:  In order of problems listed above:  1. CHB: Pacemaker dependent.  2. PPM: Normal device functioning.  Remote downloads every 3 months and yearly office visit 3. HTN: He continues to gain weight and has required additional medications  for blood pressure this year.  He needs to curtail his intake of ice cream and do some regular exercise.  His daughter will move a reclining bicycle over to his house so that he can exercise. 4. HLP: Labs performed by primary care provider in October 2018 show improvement in lipid profile on statin therapy.  All parameters at target.   Medication Adjustments/Labs and Tests Ordered: Current medicines are reviewed at length with the patient today.  Concerns regarding medicines are outlined above.  Medication changes, Labs and Tests ordered today are listed in the Patient Instructions below. Patient Instructions  Dr Sallyanne Kuster recommends that you continue on your current medications as directed. Please refer to the Current Medication list given to you today.  Remote monitoring is used to monitor your Pacemaker or ICD from home. This monitoring reduces the number of office visits required to check your device to one time per year. It allows [place] to keep an eye on the functioning of your device to ensure it is working properly. You are scheduled for a device check from home on Tuesday, February 5th, 2018. You may send your transmission at any time that day. If you have a wireless device, the transmission will be sent automatically. After your physician reviews your transmission, you will receive a notification with your next transmission date.  Dr Sallyanne Kuster recommends that you schedule a follow-up appointment in 12 months with a pacemaker check. You will  receive a reminder letter in the mail two months in advance. If you don't receive a letter, please call our office to schedule the follow-up appointment.  If you need a refill on your cardiac medications before your next appointment, please call your pharmacy.    Signed, Kenneth Klein, MD  10/19/2017 9:30 AM    [facility] [place], [place], [place]  [address] Phone: [phone]; Fax: [phone]

## 2017-10-20 NOTE — Addendum Note (Signed)
Addended by: Leanord Asal T on: 10/20/2017 03:33 PM   Modules accepted: Orders

## 2017-12-21 ENCOUNTER — Ambulatory Visit (INDEPENDENT_AMBULATORY_CARE_PROVIDER_SITE_OTHER): Payer: Medicare Other | Admitting: *Deleted

## 2017-12-21 DIAGNOSIS — I442 Atrioventricular block, complete: Secondary | ICD-10-CM | POA: Diagnosis not present

## 2017-12-21 NOTE — Progress Notes (Signed)
Remote pacemaker transmission.   

## 2017-12-22 ENCOUNTER — Encounter: Payer: Self-pay | Admitting: Cardiology

## 2017-12-24 ENCOUNTER — Telehealth: Payer: Self-pay | Admitting: Cardiovascular Disease

## 2017-12-24 NOTE — Telephone Encounter (Signed)
LVM informing that transmission was received.

## 2017-12-24 NOTE — Telephone Encounter (Signed)
New Message:    She wants to know if you received pt's transmission from 12-21-17?

## 2018-01-17 LAB — CUP PACEART REMOTE DEVICE CHECK
Battery Remaining Percentage: 81 %
Brady Statistic AP VP Percent: 99 %
Brady Statistic AP VS Percent: 1 %
Brady Statistic AS VP Percent: 1 %
Brady Statistic RV Percent Paced: 99 %
Implantable Lead Implant Date: 19980305
Implantable Lead Implant Date: 19980305
Implantable Lead Location: 753859
Lead Channel Impedance Value: 550 Ohm
Lead Channel Pacing Threshold Amplitude: 1.125 V
Lead Channel Sensing Intrinsic Amplitude: 12 mV
Lead Channel Setting Pacing Amplitude: 2 V
Lead Channel Setting Pacing Pulse Width: 0.4 ms
Lead Channel Setting Sensing Sensitivity: 2 mV
MDC IDC LEAD LOCATION: 753860
MDC IDC MSMT BATTERY REMAINING LONGEVITY: 88 mo
MDC IDC MSMT BATTERY VOLTAGE: 2.93 V
MDC IDC MSMT LEADCHNL RA IMPEDANCE VALUE: 440 Ohm
MDC IDC MSMT LEADCHNL RA PACING THRESHOLD AMPLITUDE: 0.75 V
MDC IDC MSMT LEADCHNL RA PACING THRESHOLD PULSEWIDTH: 0.4 ms
MDC IDC MSMT LEADCHNL RA SENSING INTR AMPL: 4.3 mV
MDC IDC MSMT LEADCHNL RV PACING THRESHOLD PULSEWIDTH: 0.4 ms
MDC IDC PG IMPLANT DT: 20140218
MDC IDC SESS DTM: 20190205074647
MDC IDC SET LEADCHNL RV PACING AMPLITUDE: 1.375
MDC IDC STAT BRADY AS VS PERCENT: 1 %
MDC IDC STAT BRADY RA PERCENT PACED: 99 %
Pulse Gen Model: 2210
Pulse Gen Serial Number: 7448063

## 2018-03-22 ENCOUNTER — Ambulatory Visit (INDEPENDENT_AMBULATORY_CARE_PROVIDER_SITE_OTHER): Payer: Medicare Other | Admitting: *Deleted

## 2018-03-22 DIAGNOSIS — I442 Atrioventricular block, complete: Secondary | ICD-10-CM | POA: Diagnosis not present

## 2018-03-22 NOTE — Progress Notes (Signed)
Remote pacemaker transmission.   

## 2018-03-23 ENCOUNTER — Encounter: Payer: Self-pay | Admitting: Cardiology

## 2018-04-08 LAB — CUP PACEART REMOTE DEVICE CHECK
Battery Voltage: 2.92 V
Brady Statistic AP VP Percent: 99 %
Brady Statistic RA Percent Paced: 99 %
Brady Statistic RV Percent Paced: 99 %
Date Time Interrogation Session: 20190507061839
Implantable Lead Implant Date: 19980305
Implantable Lead Location: 753859
Implantable Lead Location: 753860
Implantable Pulse Generator Implant Date: 20140218
Lead Channel Impedance Value: 550 Ohm
Lead Channel Pacing Threshold Amplitude: 0.75 V
Lead Channel Pacing Threshold Pulse Width: 0.4 ms
Lead Channel Setting Pacing Pulse Width: 0.4 ms
Lead Channel Setting Sensing Sensitivity: 2 mV
MDC IDC LEAD IMPLANT DT: 19980305
MDC IDC MSMT BATTERY REMAINING LONGEVITY: 78 mo
MDC IDC MSMT BATTERY REMAINING PERCENTAGE: 73 %
MDC IDC MSMT LEADCHNL RA IMPEDANCE VALUE: 430 Ohm
MDC IDC MSMT LEADCHNL RA SENSING INTR AMPL: 4.1 mV
MDC IDC MSMT LEADCHNL RV PACING THRESHOLD AMPLITUDE: 1.375 V
MDC IDC MSMT LEADCHNL RV PACING THRESHOLD PULSEWIDTH: 0.4 ms
MDC IDC MSMT LEADCHNL RV SENSING INTR AMPL: 12 mV
MDC IDC SET LEADCHNL RA PACING AMPLITUDE: 2 V
MDC IDC SET LEADCHNL RV PACING AMPLITUDE: 1.625
MDC IDC STAT BRADY AP VS PERCENT: 1 %
MDC IDC STAT BRADY AS VP PERCENT: 1 %
MDC IDC STAT BRADY AS VS PERCENT: 1 %
Pulse Gen Model: 2210
Pulse Gen Serial Number: 7448063

## 2018-06-21 ENCOUNTER — Ambulatory Visit (INDEPENDENT_AMBULATORY_CARE_PROVIDER_SITE_OTHER): Payer: Medicare Other | Admitting: *Deleted

## 2018-06-21 DIAGNOSIS — I442 Atrioventricular block, complete: Secondary | ICD-10-CM | POA: Diagnosis not present

## 2018-06-22 NOTE — Progress Notes (Signed)
Remote pacemaker transmission.   

## 2018-07-19 LAB — CUP PACEART REMOTE DEVICE CHECK
Battery Remaining Percentage: 73 %
Battery Voltage: 2.92 V
Brady Statistic AP VP Percent: 99 %
Brady Statistic AP VS Percent: 1 %
Brady Statistic AS VP Percent: 1 %
Brady Statistic AS VS Percent: 1 %
Implantable Lead Implant Date: 19980305
Implantable Lead Location: 753859
Lead Channel Impedance Value: 530 Ohm
Lead Channel Pacing Threshold Amplitude: 0.75 V
Lead Channel Pacing Threshold Pulse Width: 0.4 ms
Lead Channel Sensing Intrinsic Amplitude: 4.6 mV
Lead Channel Setting Pacing Amplitude: 1.5 V
MDC IDC LEAD IMPLANT DT: 19980305
MDC IDC LEAD LOCATION: 753860
MDC IDC MSMT BATTERY REMAINING LONGEVITY: 78 mo
MDC IDC MSMT LEADCHNL RA IMPEDANCE VALUE: 450 Ohm
MDC IDC MSMT LEADCHNL RA PACING THRESHOLD PULSEWIDTH: 0.4 ms
MDC IDC MSMT LEADCHNL RV PACING THRESHOLD AMPLITUDE: 1.25 V
MDC IDC MSMT LEADCHNL RV SENSING INTR AMPL: 12 mV
MDC IDC PG IMPLANT DT: 20140218
MDC IDC PG SERIAL: 7448063
MDC IDC SESS DTM: 20190806065428
MDC IDC SET LEADCHNL RA PACING AMPLITUDE: 2 V
MDC IDC SET LEADCHNL RV PACING PULSEWIDTH: 0.4 ms
MDC IDC SET LEADCHNL RV SENSING SENSITIVITY: 2 mV
MDC IDC STAT BRADY RA PERCENT PACED: 99 %
MDC IDC STAT BRADY RV PERCENT PACED: 99 %

## 2018-09-20 ENCOUNTER — Ambulatory Visit (INDEPENDENT_AMBULATORY_CARE_PROVIDER_SITE_OTHER): Payer: Medicare Other | Admitting: *Deleted

## 2018-09-20 DIAGNOSIS — I442 Atrioventricular block, complete: Secondary | ICD-10-CM | POA: Diagnosis not present

## 2018-09-20 DIAGNOSIS — I10 Essential (primary) hypertension: Secondary | ICD-10-CM

## 2018-09-21 NOTE — Progress Notes (Signed)
Remote pacemaker transmission.   

## 2018-09-25 ENCOUNTER — Encounter: Payer: Self-pay | Admitting: Cardiology

## 2018-10-19 ENCOUNTER — Ambulatory Visit (INDEPENDENT_AMBULATORY_CARE_PROVIDER_SITE_OTHER): Payer: Medicare Other | Admitting: Cardiovascular Disease

## 2018-10-19 ENCOUNTER — Encounter: Payer: Self-pay | Admitting: Cardiovascular Disease

## 2018-10-19 VITALS — BP 149/63 | HR 70 | Ht 65.0 in | Wt 185.8 lb

## 2018-10-19 DIAGNOSIS — E7800 Pure hypercholesterolemia, unspecified: Secondary | ICD-10-CM

## 2018-10-19 DIAGNOSIS — I495 Sick sinus syndrome: Secondary | ICD-10-CM | POA: Diagnosis not present

## 2018-10-19 DIAGNOSIS — Z950 Presence of cardiac pacemaker: Secondary | ICD-10-CM | POA: Diagnosis not present

## 2018-10-19 DIAGNOSIS — I498 Other specified cardiac arrhythmias: Secondary | ICD-10-CM | POA: Diagnosis not present

## 2018-10-19 DIAGNOSIS — I442 Atrioventricular block, complete: Secondary | ICD-10-CM | POA: Diagnosis not present

## 2018-10-19 DIAGNOSIS — I10 Essential (primary) hypertension: Secondary | ICD-10-CM | POA: Diagnosis not present

## 2018-10-19 NOTE — Patient Instructions (Signed)
Medication Instructions:  Dr Sallyanne Kuster has recommended making the following medication changes: 1. STOP Aspirin  If you need a refill on your cardiac medications before your next appointment, please call your pharmacy.   Testing/Procedures: Remote monitoring is used to monitor your Pacemaker of ICD from home. This monitoring reduces the number of office visits required to check your device to one time per year. It allows [place] to keep an eye on the functioning of your device to ensure it is working properly. You are scheduled for a device check from home on Tuesday, February 4th, 2020. You may send your transmission at any time that day. If you have a wireless device, the transmission will be sent automatically. After your physician reviews your transmission, you will receive a postcard with your next transmission date.  Follow-Up: At [facility], you and your health needs are our priority.  As part of our continuing mission to provide you with exceptional heart care, we have created designated Provider Care Teams.  These Care Teams include your primary Cardiologist (physician) and Advanced Practice Providers (APPs -  Physician Assistants and Nurse Practitioners) who all work together to provide you with the care you need, when you need it. You will need a follow up appointment in 12 months.  Please call our office 2 months in advance to schedule this appointment.  You may see Sanda Klein, MD or one of the following Advanced Practice Providers on your designated Care Team: [place], [place] . Fabian Sharp, PA-C

## 2018-10-19 NOTE — Progress Notes (Signed)
Cardiology Office Note    Date:  10/20/2018   ID:  Kenneth Clay, DOB [birth year], MRN [id number]  PCP:  Kenneth Manes, MD  Cardiologist:  Kenneth Klein, MD   Chief Complaint  Patient presents with  . Pacemaker Check    History of Present Illness:  Kenneth Clay is a 84 y.o. male with complete heart block but without known structural heart disease returning for pacemaker follow-up.   Mrs. Kenneth Clay's memory problems have deteriorated substantially faster and she is now a resident in the [facility] home.  While Mr. Kenneth Clay also has memory issues, he is better compensated and is living with his daughter, with caregivers that spend the day with him.  His typical blood pressure at home is around 130-135/70-75.  His blood pressure was elevated when he first checked in but was improving rapidly during the appointment. The patient specifically denies any chest pain at rest exertion, dyspnea at rest or with exertion, orthopnea, paroxysmal nocturnal dyspnea, syncope, palpitations, focal neurological deficits, intermittent claudication, lower extremity edema, unexplained weight gain, cough, hemoptysis or wheezing.  His daughter confirms that he is doing well from a physical point of view he has no complaints.  Tried to restrict his intake of sweets, but he has a particular weakness for cookies.  Pacemaker check performed today shows that his [address] device still has roughly 5.5-6.2 years of battery longevity.  As before, he has 100% atrial pacing in the 100% ventricular pacing.  He is pacemaker dependent.  As previously recorded he has rare episodes of paroxysmal atrial tachycardia (only one since his last pacemaker check) lasting a few seconds.  There is no atrial fibrillation.    He has no underlying atrial activity whatsoever (atrial standstill) he does not have a junctional idioventricular escape rhythm.  When pacing the atrium at 30 bpm he does have 1: 1 AV  conduction, when pacing at 60 bpm he has complete heart block.  He takes been as appropriate for hypertension and atorvastatin for hyperlipidemia. He had a normal nuclear stress test in 2011 and has normal left ventricular systolic function. Although his dementia is much milder than his wife's, he also takes donepezil and memantine.   Past Medical History:  Diagnosis Date  . CHB (complete heart block) ([place]) 09/03/2015  . Dementia ([place])   . Depression   . H/O cardiovascular stress test 04/30/2010   Persantine myoview-normal  . H/O: GI bleed   . Hyperlipidemia   . Hypertension   . LV dysfunction    last echo-04/30/10, EF 50-55%, impaired LV relaxation  . Pacemaker    new gen. last check with insert 01/03/13  . Sinus node dysfunction (HCC)   . Ulcer     Past Surgical History:  Procedure Laterality Date  . PACEMAKER GENERATOR CHANGE  2005   St. Jude Idenity ADxXL DR  . PACEMAKER GENERATOR CHANGE  01/03/2013   ST. Jude Accent DR RF  . PACEMAKER GENERATOR CHANGE N/A 01/03/2013   Procedure: PACEMAKER GENERATOR CHANGE;  Surgeon: Kenneth Klein, MD;  Location: [facility] CATH [facility];  Service: Cardiovascular;  Laterality: N/A;  . PACEMAKER INSERTION  1998  . ROTATOR CUFF REPAIR     bilateral  . sciatica     injection 06-2015  . US ECHOCARDIOGRAPHY  04/30/10   aortic root sclerotic,RV mildly dilated    Current Medications: Outpatient Medications Prior to Visit  Medication Sig Dispense Refill  . amLODipine (NORVASC) 2.5 MG tablet Take 2.5  mg by mouth daily.    . Ascorbic Acid (VITAMIN C) 1000 MG tablet Take 1,000 mg by mouth daily.    Marland Kitchen atorvastatin (LIPITOR) 10 MG tablet Take 1 tablet (10 mg total) by mouth daily at 6 PM. 30 tablet 0  . benazepril (LOTENSIN) 40 MG tablet Take 1 tablet (40 mg total) by mouth daily. 30 tablet 11  . busPIRone (BUSPAR) 5 MG tablet Take 5 mg by mouth 2 (two) times daily.     . citalopram (CELEXA) 10 MG tablet Take 10 mg by mouth daily.    Marland Kitchen donepezil (ARICEPT) 10 MG  tablet Take 1 tablet (10 mg total) by mouth at bedtime. 30 tablet 6  . memantine (NAMENDA) 10 MG tablet Take 1 tablet (10 mg total) by mouth 2 (two) times daily. 60 tablet 6  . pantoprazole (PROTONIX) 40 MG tablet Take 40 mg by mouth daily.     Marland Kitchen aspirin EC 81 MG tablet Take 81 mg by mouth daily.    . citalopram (CELEXA) 10 MG tablet Take 0.5 tablets (5 mg total) by mouth daily. (Patient not taking: Reported on 10/19/2018) 15 tablet 1   No facility-administered medications prior to visit.      Allergies:   Patient has no known allergies.   Social History   Socioeconomic History  . Marital status: Married    Spouse name: Kenneth Clay  . Number of children: 3  . Years of education: Not on file  . Highest education level: Not on file  Occupational History  . Occupation: Medical laboratory scientific officer: RETIRED  Social Needs  . Financial resource strain: Not on file  . Food insecurity:    Worry: Not on file    Inability: Not on file  . Transportation needs:    Medical: Not on file    Non-medical: Not on file  Tobacco Use  . Smoking status: Former Smoker    Last attempt to quit: 11/16/2004    Years since quitting: 13.9  . Smokeless tobacco: Never Used  Substance and Sexual Activity  . Alcohol use: No  . Drug use: No  . Sexual activity: Not on file  Lifestyle  . Physical activity:    Days per week: Not on file    Minutes per session: Not on file  . Stress: Not on file  Relationships  . Social connections:    Talks on phone: Not on file    Gets together: Not on file    Attends religious service: Not on file    Active member of club or organization: Not on file    Attends meetings of clubs or organizations: Not on file    Relationship status: Not on file  Other Topics Concern  . Not on file  Social History Narrative  . Not on file     Family History:  The patient's family history includes Diabetes in his brother and mother; Heart disease in his father.   ROS:   Please see  the history of present illness.    All other systems are reviewed and are negative   PHYSICAL EXAM:   VS:  BP (!) 149/63   Pulse 70   Ht 5\' 5"  (1.651 m)   Wt 185 lb 12.8 oz (84.3 kg)   BMI 30.92 kg/m    General: Alert, oriented x3, no distress, mildly obese Head: no evidence of trauma, PERRL, EOMI, no exophtalmos or lid lag, no myxedema, no xanthelasma; normal ears, nose and oropharynx Neck: normal  jugular venous pulsations and no hepatojugular reflux; brisk carotid pulses without delay and no carotid bruits Chest: clear to auscultation, no signs of consolidation by percussion or palpation, normal fremitus, symmetrical and full respiratory excursions Cardiovascular: normal position and quality of the apical impulse, regular rhythm, normal first and second heart sounds, no murmurs, rubs or gallops Abdomen: no tenderness or distention, no masses by palpation, no abnormal pulsatility or arterial bruits, normal bowel sounds, no hepatosplenomegaly Extremities: no clubbing, cyanosis or edema; 2+ radial, ulnar and brachial pulses bilaterally; 2+ right femoral, posterior tibial and dorsalis pedis pulses; 2+ left femoral, posterior tibial and dorsalis pedis pulses; no subclavian or femoral bruits Neurological: grossly nonfocal Psych: Normal mood and affect   Wt Readings from Last 3 Encounters:  10/19/18 185 lb 12.8 oz (84.3 kg)  10/19/17 192 lb (87.1 kg)  07/21/17 186 lb 6.4 oz (84.6 kg)      Studies/Labs Reviewed:   EKG:  EKG is ordered today.  The ekg ordered today demonstrates AV sequential pacing  Recent Labs: No results found for requested labs within last 8760 hours.   Lipid Panel    Component Value Date/Time   CHOL 163 01/13/2016 0443   TRIG 120 01/13/2016 0443   HDL 22 (L) 01/13/2016 0443   CHOLHDL 7.4 01/13/2016 0443   VLDL 24 01/13/2016 0443   LDLCALC 117 (H) 01/13/2016 0443    August 19, 2017 total cholesterol 123, HDL 46, LDL 50, triglycerides 131, fasting glucose  101, creatinine 0.84, potassium 3.9, normal liver function tests  ASSESSMENT:    1. CHB (complete heart block) (HCC)   2. Sinus node dysfunction (HCC)   3. Essential hypertension   4. Pacemaker   5. Hypercholesterolemia   6. 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 CHB: Pacemaker dependent.  2. SSS: Atrial standstill.  Heart rate histogram distribution is appropriate for his sedentary status 3. PPM: Normal device function with 100% atrial pacing and ventricular pacing. 4. HTN: Despite continued weight gain his blood pressure control is adequate.  He never did use the supine bicycle but his daughter brought him.  He cut back on the ice cream but has replaced it with cookies.  Considering his age and his neurological issues, I do not think we should question too hard. 5. HLP: Labs performed by primary care provider in October 2018 show improvement in lipid profile on statin therapy.  Will request more recent labs from his PCP.  6. Considering the recent negative data for aspirin in primary prevention, will discontinue this medication   Medication Adjustments/Labs and Tests Ordered: Current medicines are reviewed at length with the patient today.  Concerns regarding medicines are outlined above.  Medication changes, Labs and Tests ordered today are listed in the Patient Instructions below. Patient Instructions  Medication Instructions:  Dr Sallyanne Kuster has recommended making the following medication changes: 1. STOP Aspirin  If you need a refill on your cardiac medications before your next appointment, please call your pharmacy.   Testing/Procedures: Remote monitoring is used to monitor your Pacemaker of ICD from home. This monitoring reduces the number of office visits required to check your device to one time per year. It allows [place] to keep an eye on the functioning of your device to ensure it is  working properly. You are scheduled for a device check from home on Tuesday, February 4th, 2020. You may send your transmission  at any time that day. If you have a wireless device, the transmission will be sent automatically. After your physician reviews your transmission, you will receive a postcard with your next transmission date.  Follow-Up: At [facility], you and your health needs are our priority.  As part of our continuing mission to provide you with exceptional heart care, we have created designated Provider Care Teams.  These Care Teams include your primary Cardiologist (physician) and Advanced Practice Providers (APPs -  Physician Assistants and Nurse Practitioners) who all work together to provide you with the care you need, when you need it. You will need a follow up appointment in 12 months.  Please call our office 2 months in advance to schedule this appointment.  You may see Kenneth Klein, MD or one of the following Advanced Practice Providers on your designated Care Team: [place], [place] . Fabian Sharp, PA-C    Signed, Kenneth Klein, MD  10/20/2018 6:38 PM    [facility], [place], [place]  [address] Phone: [phone]; Fax: [phone]

## 2018-10-20 ENCOUNTER — Encounter: Payer: Self-pay | Admitting: Cardiovascular Disease

## 2018-10-20 DIAGNOSIS — E7800 Pure hypercholesterolemia, unspecified: Secondary | ICD-10-CM | POA: Insufficient documentation

## 2018-11-20 LAB — CUP PACEART REMOTE DEVICE CHECK
Battery Remaining Longevity: 69 mo
Brady Statistic AP VP Percent: 99 %
Brady Statistic AP VS Percent: 1 %
Brady Statistic AS VP Percent: 1 %
Brady Statistic RA Percent Paced: 99 %
Brady Statistic RV Percent Paced: 99 %
Implantable Lead Implant Date: 19980305
Implantable Lead Location: 753859
Implantable Lead Location: 753860
Lead Channel Impedance Value: 440 Ohm
Lead Channel Pacing Threshold Amplitude: 0.75 V
Lead Channel Pacing Threshold Amplitude: 1.5 V
Lead Channel Pacing Threshold Pulse Width: 0.4 ms
Lead Channel Sensing Intrinsic Amplitude: 12 mV
Lead Channel Setting Pacing Amplitude: 1.75 V
Lead Channel Setting Pacing Amplitude: 2 V
MDC IDC LEAD IMPLANT DT: 19980305
MDC IDC MSMT BATTERY REMAINING PERCENTAGE: 65 %
MDC IDC MSMT BATTERY VOLTAGE: 2.9 V
MDC IDC MSMT LEADCHNL RA SENSING INTR AMPL: 3.8 mV
MDC IDC MSMT LEADCHNL RV IMPEDANCE VALUE: 510 Ohm
MDC IDC MSMT LEADCHNL RV PACING THRESHOLD PULSEWIDTH: 0.4 ms
MDC IDC PG IMPLANT DT: 20140218
MDC IDC PG SERIAL: 7448063
MDC IDC SESS DTM: 20191105072633
MDC IDC SET LEADCHNL RV PACING PULSEWIDTH: 0.4 ms
MDC IDC SET LEADCHNL RV SENSING SENSITIVITY: 2 mV
MDC IDC STAT BRADY AS VS PERCENT: 1 %
Pulse Gen Model: 2210

## 2018-12-06 LAB — CUP PACEART INCLINIC DEVICE CHECK
Date Time Interrogation Session: 20200121162101
Implantable Lead Implant Date: 19980305
Implantable Lead Implant Date: 19980305
Implantable Lead Location: 753859
Implantable Lead Location: 753860
Implantable Pulse Generator Implant Date: 20140218
Pulse Gen Model: 2210
Pulse Gen Serial Number: 7448063

## 2018-12-20 ENCOUNTER — Ambulatory Visit (INDEPENDENT_AMBULATORY_CARE_PROVIDER_SITE_OTHER): Payer: Medicare Other

## 2018-12-20 DIAGNOSIS — I495 Sick sinus syndrome: Secondary | ICD-10-CM

## 2018-12-20 DIAGNOSIS — I442 Atrioventricular block, complete: Secondary | ICD-10-CM | POA: Diagnosis not present

## 2018-12-21 LAB — CUP PACEART REMOTE DEVICE CHECK
Battery Remaining Longevity: 71 mo
Battery Remaining Percentage: 65 %
Brady Statistic AP VP Percent: 99 %
Brady Statistic AP VS Percent: 1 %
Brady Statistic AS VP Percent: 1 %
Brady Statistic AS VS Percent: 1 %
Brady Statistic RA Percent Paced: 99 %
Brady Statistic RV Percent Paced: 99 %
Date Time Interrogation Session: 20200204074407
Implantable Lead Implant Date: 19980305
Implantable Lead Location: 753859
Implantable Lead Location: 753860
Implantable Pulse Generator Implant Date: 20140218
Lead Channel Impedance Value: 440 Ohm
Lead Channel Impedance Value: 590 Ohm
Lead Channel Pacing Threshold Amplitude: 0.75 V
Lead Channel Pacing Threshold Amplitude: 1 V
Lead Channel Pacing Threshold Pulse Width: 0.4 ms
Lead Channel Pacing Threshold Pulse Width: 0.4 ms
Lead Channel Sensing Intrinsic Amplitude: 12 mV
Lead Channel Setting Pacing Amplitude: 1.25 V
Lead Channel Setting Pacing Amplitude: 2 V
Lead Channel Setting Pacing Pulse Width: 0.4 ms
Lead Channel Setting Sensing Sensitivity: 2 mV
MDC IDC LEAD IMPLANT DT: 19980305
MDC IDC MSMT BATTERY VOLTAGE: 2.9 V
MDC IDC MSMT LEADCHNL RA SENSING INTR AMPL: 4.2 mV
Pulse Gen Model: 2210
Pulse Gen Serial Number: 7448063

## 2018-12-28 NOTE — Progress Notes (Signed)
Remote pacemaker transmission.   

## 2019-01-03 ENCOUNTER — Other Ambulatory Visit: Payer: Self-pay | Admitting: Geriatric Medicine

## 2019-01-03 DIAGNOSIS — R2231 Localized swelling, mass and lump, right upper limb: Secondary | ICD-10-CM

## 2019-01-09 ENCOUNTER — Other Ambulatory Visit: Payer: Self-pay | Admitting: Geriatric Medicine

## 2019-01-09 ENCOUNTER — Ambulatory Visit
Admission: RE | Admit: 2019-01-09 | Discharge: 2019-01-09 | Disposition: A | Discharge status: Home / Self Care | Payer: Medicare Other | Source: Ambulatory Visit | Attending: Geriatric Medicine | Admitting: Geriatric Medicine

## 2019-01-09 DIAGNOSIS — R2231 Localized swelling, mass and lump, right upper limb: Secondary | ICD-10-CM

## 2019-01-12 ENCOUNTER — Other Ambulatory Visit (HOSPITAL_COMMUNITY)
Admission: RE | Admit: 2019-01-12 | Discharge: 2019-01-12 | Disposition: A | Discharge status: Home / Self Care | Payer: Medicare Other | Source: Ambulatory Visit | Attending: Geriatric Medicine | Admitting: Geriatric Medicine

## 2019-01-12 ENCOUNTER — Ambulatory Visit
Admission: RE | Admit: 2019-01-12 | Discharge: 2019-01-12 | Disposition: A | Discharge status: Home / Self Care | Payer: Medicare Other | Source: Ambulatory Visit | Attending: Geriatric Medicine | Admitting: Geriatric Medicine

## 2019-01-12 DIAGNOSIS — R2231 Localized swelling, mass and lump, right upper limb: Secondary | ICD-10-CM

## 2019-01-23 NOTE — Progress Notes (Signed)
HEMATOLOGY/ONCOLOGY CONSULTATION NOTE  Date of Service: 01/24/2019  Patient Care Team: Lajean Manes, MD as PCP - General (Internal Medicine) Croitoru, Dani Gobble, MD as PCP - Cardiology (Cardiology)  CHIEF COMPLAINTS/PURPOSE OF CONSULTATION:  Small Lymphocytic Lymphoma  HISTORY OF PRESENTING ILLNESS:   Kenneth Kenneth Clay is a wonderful 85 y.o. Kenneth Clay who has been referred to [place] by Dr. Lajean Manes for evaluation and management of Small Lymphocytic Lymphoma. He is accompanied today by his 3 daughters. The pt reports that he is doing well overall.   The pt's 3 daughters note that they share power of attorney together, as the patient has Alzheimer's dementia.  The pt's daughter notes that the first time the lump under his right arm was noticed was on 12/31/18, when she was washing under the patient's armpit. He notes that he is not in any pain. He denies feeling any differently currently as compared to 6-12 months ago. The pt and his family note that he is eating very well and hasn't lost any weight. The pt denies any new skin rashes. He denies abdominal pains.  The pt has a caretaker with him every day from 11am-6pm.  The pt's family notes that he has been taking Alzheimer's medication for more than 10 years, and that he has become a "little more aggressive" recently.   Of note prior to the patient's visit today, pt has had a Mammogram completed on 01/09/19 with results revealing Ultrasound-guided biopsy of a single right axillary lymph node with specimens to be sent in formalin AND saline. I have discussed the findings and recommendations with the patient. Results were also provided in writing at the conclusion of the visit. If applicable, a reminder letter will be sent to the patient regarding the next appointment. BI-RADS CATEGORY  4: Suspicious.  Most recent lab results (08/30/18) of CBC w/diff is as follows: all values are WNL.  On review of systems, pt reports eating well, stable energy  levels, stable weight, lump in right armpit, and denies abdominal pains, skin rashes other lumps or bumps, and any other symptoms.  MEDICAL HISTORY:  Past Medical History:  Diagnosis Date  . CHB (complete heart block) ([place]) 09/03/2015  . Dementia ([place])   . Depression   . H/O cardiovascular stress test 04/30/2010   Persantine myoview-normal  . H/O: GI bleed   . Hyperlipidemia   . Hypertension   . LV dysfunction    last echo-04/30/10, EF 50-55%, impaired LV relaxation  . Pacemaker    new gen. last check with insert 01/03/13  . Sinus node dysfunction ([facility])   . Ulcer     SURGICAL HISTORY: Past Surgical History:  Procedure Laterality Date  . PACEMAKER GENERATOR CHANGE  2005   [facility] Jude Idenity ADxXL DR  . PACEMAKER GENERATOR CHANGE  01/03/2013   [employer] Jude Accent DR RF  . PACEMAKER GENERATOR CHANGE N/A 01/03/2013   Procedure: PACEMAKER GENERATOR CHANGE;  Surgeon: Sanda Klein, MD;  Location: [facility];  Service: Cardiovascular;  Laterality: N/A;  . PACEMAKER INSERTION  1998  . ROTATOR CUFF REPAIR     bilateral  . sciatica     injection 06-2015  . US ECHOCARDIOGRAPHY  04/30/10   aortic root sclerotic,RV mildly dilated    SOCIAL HISTORY: Social History   Socioeconomic History  . Marital status: Married    Spouse name: [name]  . Number of children: 3  . Years of education: Not on file  . Highest education level: Not on file  Occupational  History  . Occupation: Medical laboratory scientific officer: RETIRED  Social Needs  . Financial resource strain: Not on file  . Food insecurity:    Worry: Not on file    Inability: Not on file  . Transportation needs:    Medical: Not on file    Non-medical: Not on file  Tobacco Use  . Smoking status: Former Smoker    Last attempt to quit: 11/16/2004    Years since quitting: 14.1  . Smokeless tobacco: Never Used  Substance and Sexual Activity  . Alcohol use: No  . Drug use: No  . Sexual activity: Not on file  Lifestyle  . Physical  activity:    Days per week: Not on file    Minutes per session: Not on file  . Stress: Not on file  Relationships  . Social connections:    Talks on phone: Not on file    Gets together: Not on file    Attends religious service: Not on file    Active member of club or organization: Not on file    Attends meetings of clubs or organizations: Not on file    Relationship status: Not on file  . Intimate partner violence:    Fear of current or ex partner: Not on file    Emotionally abused: Not on file    Physically abused: Not on file    Forced sexual activity: Not on file  Other Topics Concern  . Not on file  Social History Narrative  . Not on file    FAMILY HISTORY: Family History  Problem Relation Age of Onset  . Diabetes Mother   . Heart disease Father   . Diabetes Brother     ALLERGIES:  has No Known Allergies.  MEDICATIONS:  Current Outpatient Medications  Medication Sig Dispense Refill  . amLODipine (NORVASC) 2.5 MG tablet Take 2.5 mg by mouth daily.    . Ascorbic Acid (VITAMIN C) 1000 MG tablet Take 1,000 mg by mouth daily.    Marland Kitchen atorvastatin (LIPITOR) 10 MG tablet Take 1 tablet (10 mg total) by mouth daily at 6 PM. 30 tablet 0  . benazepril (LOTENSIN) 40 MG tablet Take 1 tablet (40 mg total) by mouth daily. 30 tablet 11  . busPIRone (BUSPAR) 5 MG tablet Take 5 mg by mouth 2 (two) times daily.     . citalopram (CELEXA) 10 MG tablet Take 10 mg by mouth daily.    Marland Kitchen donepezil (ARICEPT) 10 MG tablet Take 1 tablet (10 mg total) by mouth at bedtime. 30 tablet 6  . memantine (NAMENDA) 10 MG tablet Take 1 tablet (10 mg total) by mouth 2 (two) times daily. 60 tablet 6  . pantoprazole (PROTONIX) 40 MG tablet Take 40 mg by mouth daily.      No current facility-administered medications for this visit.     REVIEW OF SYSTEMS:    10 Point review of Systems was done is negative except as noted above.  PHYSICAL EXAMINATION: ECOG PERFORMANCE STATUS: 3 - Symptomatic, >50% confined  to bed  . Vitals:   01/24/19 1335  BP: (!) 155/64  Pulse: (!) 102  Resp: 18  Temp: 98.6 F (37 C)  SpO2: 98%   Filed Weights   01/24/19 1335  Weight: 180 lb (81.6 kg)   .Body mass index is 29.95 kg/m.  GENERAL:alert, in no acute distress and comfortable SKIN: no acute rashes, no significant lesions EYES: conjunctiva are pink and non-injected, sclera anicteric OROPHARYNX: MMM, no  exudates, no oropharyngeal erythema or ulceration NECK: supple, no JVD LYMPH: 7-8cm soft non-attached, non tender LN in the right axilla. Couple borderline lymph nodes in right inguinal, 1cm lymph nodes in left inguinal, no palpable lymphadenopathy in the cervical region LUNGS: clear to auscultation b/l with normal respiratory effort HEART: regular rate & rhythm ABDOMEN:  normoactive bowel sounds , non tender, not distended. Just about palpable spleen under left costal margin. Extremity: no pedal edema PSYCH: alert & oriented x 3 with fluent speech NEURO: no focal motor/sensory deficits  LABORATORY DATA:  I have reviewed the data as listed  . CBC Latest Ref Rng & Units 01/13/2016 01/12/2016 07/13/2011  WBC 4.0 - 10.5 K/uL 10.8(H) 12.9(H) 9.9  Hemoglobin 13.0 - 17.0 g/dL 13.4 14.7 9.0(L)  Hematocrit 39.0 - 52.0 % 40.6 42.4 26.3(L)  Platelets 150 - 400 K/uL 337 320 243   . CBC    Component Value Date/Time   WBC 10.8 (H) 01/13/2016 0443   RBC 4.32 01/13/2016 0443   HGB 13.4 01/13/2016 0443   HCT 40.6 01/13/2016 0443   PLT 337 01/13/2016 0443   MCV 94.0 01/13/2016 0443   MCH 31.0 01/13/2016 0443   MCHC 33.0 01/13/2016 0443   RDW 13.4 01/13/2016 0443   LYMPHSABS 2.5 01/12/2016 1820   MONOABS 1.6 (H) 01/12/2016 1820   EOSABS 0.3 01/12/2016 1820   BASOSABS 0.0 01/12/2016 1820     . CMP Latest Ref Rng & Units 01/13/2016 01/12/2016 07/12/2011  Glucose Kenneth - 99 mg/dL 215(H) 117(H) 101(H)  BUN 6 - 20 mg/dL '14 13 12  ' Creatinine 0.61 - 1.24 mg/dL 0.90 0.94 0.79  Sodium 135 - 145 mmol/L 140 140  143  Potassium 3.5 - 5.1 mmol/L 3.4(L) 3.4(L) 3.6  Chloride 101 - 111 mmol/L 108 103 113(H)  CO2 22 - 32 mmol/L 21(L) 27 25  Calcium 8.9 - 10.3 mg/dL 9.0 9.6 8.6  Total Protein 6.5 - 8.1 g/dL 6.5 7.2 -  Total Bilirubin 0.3 - 1.2 mg/dL 0.7 1.0 -  Alkaline Phos 38 - 126 U/L 90 105 -  AST 15 - 41 U/L 38 58(H) -  ALT 17 - 63 U/L 38 47 -    08/30/18 CBC w/diff:     01/12/19 Right Axilla LN Biopsy:    RADIOGRAPHIC STUDIES: I have personally reviewed the radiological images as listed and agreed with the findings in the report. Mm Diag Breast Tomo Bilateral  Result Date: 01/09/2019 CLINICAL DATA:  85 year old Kenneth Clay with a right axillary palpable lump for 1 week. EXAM: DIGITAL DIAGNOSTIC BILATERAL MAMMOGRAM WITH CAD AND TOMO ULTRASOUND RIGHT BREAST COMPARISON:  None. ACR Breast Density Category a: The breast tissue is almost entirely fatty. FINDINGS: A large, circumscribed hyperdense mass is seen in the right axilla, most consistent with an enlarged lymph node. No suspicious findings are identified in the left axilla. 3D evaluation of the bilateral breast demonstrate no mammographic abnormalities. Mammographic images were processed with CAD. On physical exam, I palpate a very large fixed mass in the right axilla. Targeted ultrasound is performed, showing numerous enlarged, morphologically abnormal lymph nodes in the right axilla. They demonstrate cortical thickening up to 1.7 cm. Evaluation of the left axilla for comparison also demonstrates multiple abnormal lymph nodes, the slightly less prominent than the right side. IMPRESSION: Highly suspicious bilateral axillary lymphadenopathy, right slightly greater than left. Differential includes a systemic process such as lymphoma. Recommendation is for ultrasound-guided biopsy of a right axillary lymph node. RECOMMENDATION: Ultrasound-guided biopsy of a single right  axillary lymph node with specimens to be sent in formalin AND saline. I have discussed the  findings and recommendations with the patient. Results were also provided in writing at the conclusion of the visit. If applicable, a reminder letter will be sent to the patient regarding the next appointment. BI-RADS CATEGORY  4: Suspicious. Electronically Signed   By: Kristopher Oppenheim M.D.   On: 01/09/2019 10:21   [place] Axillary Node Core Biopsy Right  Addendum Date: 01/18/2019   ADDENDUM REPORT: 01/18/2019 09:33 ADDENDUM: Pathology revealed SMALL LYMPHOCYTIC LYMPHOMA of RIGHT axillary lymph node. Tissue-Flow Cytometry revealed- MONOCLONAL B-CELL POPULATION IDENTIFIED. The phenotype is consistent with a chronic lymphocytic leukemia/small lymphocytic lymphoma. This was found to be concordant by Dr. Everlean Alstrom I called the patients' daughter Buckner Malta) to inform her of delay in pathology results on January 13, 2019. She reported the patient doing well after the biopsy. Post biopsy instructions and care were reviewed and questions were answered. The patient's daughter was encouraged to call [facility] for any additional concerns. Pathology results were called January 16, 2019 to [facility] [place] at Dr. Christiane Ha Stoneking's office. Brittny reported Dr. Felipa Eth discussed biopsy results with the patient's daughter, Bernerd Pho (patient's POA). Medical Oncology referral to be arranged by Dr. Lajean Manes. Pathology results reported by Stacie Acres, RN on 01/18/2019. Electronically Signed   By: Everlean Alstrom M.D.   On: 01/18/2019 09:33   Result Date: 01/18/2019 CLINICAL DATA:  85 year old Kenneth Clay with bulky right axillary lymphadenopathy presents for ultrasound-guided biopsy of 1 of the abnormal right axillary lymph nodes. EXAM: [place] AXILLARY NODE CORE BIOPSY RIGHT COMPARISON:  Previous exam(s). FINDINGS: I met with the patient and we discussed the procedure of ultrasound-guided biopsy, including benefits and alternatives. We discussed the high likelihood of a successful procedure. We discussed  the risks of the procedure, including infection, bleeding, tissue injury, clip migration, and inadequate sampling. Informed written consent was given. The usual time-out protocol was performed immediately prior to the procedure. Using sterile technique and 1% Lidocaine as local anesthetic, under direct ultrasound visualization, a 14 gauge spring-loaded device was used to perform biopsy of an abnormal lymph node in the right axilla using a lateral to medial approach. Specimens were placed in both saline and formalin containers. At the conclusion of the procedure a HydroMARK spiral shaped tissue marker clip was deployed into the biopsy cavity. A postprocedure mammogram was not performed. IMPRESSION: Ultrasound guided biopsy of an enlarged and morphologically abnormal lymph node in the right axilla. No apparent complications. Electronically Signed: By: Everlean Alstrom M.D. On: 01/12/2019 14:08   [place] Axilla Right  Result Date: 01/09/2019 CLINICAL DATA:  85 year old Kenneth Clay with a right axillary palpable lump for 1 week. EXAM: DIGITAL DIAGNOSTIC BILATERAL MAMMOGRAM WITH CAD AND TOMO ULTRASOUND RIGHT BREAST COMPARISON:  None. ACR Breast Density Category a: The breast tissue is almost entirely fatty. FINDINGS: A large, circumscribed hyperdense mass is seen in the right axilla, most consistent with an enlarged lymph node. No suspicious findings are identified in the left axilla. 3D evaluation of the bilateral breast demonstrate no mammographic abnormalities. Mammographic images were processed with CAD. On physical exam, I palpate a very large fixed mass in the right axilla. Targeted ultrasound is performed, showing numerous enlarged, morphologically abnormal lymph nodes in the right axilla. They demonstrate cortical thickening up to 1.7 cm. Evaluation of the left axilla for comparison also demonstrates multiple abnormal lymph nodes, the slightly less prominent than the right side. IMPRESSION: Highly  suspicious bilateral  axillary lymphadenopathy, right slightly greater than left. Differential includes a systemic process such as lymphoma. Recommendation is for ultrasound-guided biopsy of a right axillary lymph node. RECOMMENDATION: Ultrasound-guided biopsy of a single right axillary lymph node with specimens to be sent in formalin AND saline. I have discussed the findings and recommendations with the patient. Results were also provided in writing at the conclusion of the visit. If applicable, a reminder letter will be sent to the patient regarding the next appointment. BI-RADS CATEGORY  4: Suspicious. Electronically Signed   By: Kristopher Oppenheim M.D.   On: 01/09/2019 10:21    ASSESSMENT & PLAN:   85 y.o. Kenneth Clay with  1. Small Lymphocytic Lymphoma PLAN -Discussed patient's most recent labs from 08/30/18, blood counts all normal including WBC at 7.4k, with 31% lymphocytes -Discussed the 01/12/19 Right Axilla LN Biopsy which revealed Small Lymphocytic Lymphoma -Discussed the 01/09/19 Mammogram which revealed Ultrasound-guided biopsy of a single right axillary lymph node with specimens to be sent in formalin AND saline. I have discussed the findings and recommendations with the patient. Results were also provided in writing at the conclusion of the visit. If applicable, a reminder letter will be sent to the patient regarding the next appointment. BI-RADS CATEGORY  4: Suspicious. -Discussed with the patient's family the diagnosis of this indolent slow-moving lymphoma, and indications for treatment including constitutional symptoms, cytopenias, and threat of organ injury. Pt is not having symptoms nor cytopenias. -Discussed that the patient's threshold for treatment is relatively higher in light of his other medical problems including Alzheimer's dementia, suggestive of a conservative approach which the pt's daughters and POA agree with -In light of a conservative approach, recommend watchful observation with labs and clinical  examination, with me or his PCP Dr. Felipa Eth -Will see the pt back in 4 months   RTC with Dr Irene Limbo with labs in 4 months   All of the patients questions were answered with apparent satisfaction. The patient knows to call the clinic with any problems, questions or concerns.  The total time spent in the appt was 35 minutes and more than 50% was on counseling and direct patient cares.    Sullivan Lone MD MS AAHIVMS [facility] Hematology/Oncology [facility]  (Office):       [phone] (Work cell):  [phone] (Fax):           [phone]  01/24/2019 2:37 PM  I, Baldwin Jamaica, am acting as a scribe for Dr. Sullivan Lone.   .I have reviewed the above documentation for accuracy and completeness, and I agree with the above. Brunetta Genera MD

## 2019-01-24 ENCOUNTER — Telehealth: Payer: Self-pay | Admitting: Hematology

## 2019-01-24 ENCOUNTER — Inpatient Hospital Stay: Payer: Medicare Other | Attending: Hematology | Admitting: Hematology

## 2019-01-24 VITALS — BP 155/64 | HR 102 | Temp 98.6°F | Resp 18 | Ht 65.0 in | Wt 180.0 lb

## 2019-01-24 DIAGNOSIS — G309 Alzheimer's disease, unspecified: Secondary | ICD-10-CM

## 2019-01-24 DIAGNOSIS — I10 Essential (primary) hypertension: Secondary | ICD-10-CM

## 2019-01-24 DIAGNOSIS — F0280 Dementia in other diseases classified elsewhere without behavioral disturbance: Secondary | ICD-10-CM | POA: Diagnosis not present

## 2019-01-24 DIAGNOSIS — C8584 Other specified types of non-Hodgkin lymphoma, lymph nodes of axilla and upper limb: Secondary | ICD-10-CM | POA: Diagnosis not present

## 2019-01-24 DIAGNOSIS — C8300 Small cell B-cell lymphoma, unspecified site: Secondary | ICD-10-CM

## 2019-01-24 NOTE — Telephone Encounter (Signed)
Called and scheduled appt per 3/10 los.  Patient aware of appt date and time/

## 2019-02-16 ENCOUNTER — Other Ambulatory Visit: Payer: Self-pay | Admitting: Hematology

## 2019-03-07 ENCOUNTER — Telehealth: Payer: Self-pay | Admitting: *Deleted

## 2019-03-07 NOTE — Telephone Encounter (Signed)
Daughter Ancil Boozer) called. Asks if labs needed by Dr. Irene Limbo can be done before patient's appt w/ Dr. Felipa Eth in June instead of before appt with Dr. Irene Limbo in July.

## 2019-03-14 NOTE — Telephone Encounter (Signed)
Per Dr. Irene Limbo, this is ok. Contacted Dr. Carlyle Lipa office [phone], they will send results from June testing to Dr. Irene Limbo. Orders from Dr. Irene Limbo for CBC w/diff, CMP and LDH will be sent/called/faxed Dr. Felipa Eth office.

## 2019-03-28 ENCOUNTER — Ambulatory Visit (INDEPENDENT_AMBULATORY_CARE_PROVIDER_SITE_OTHER): Payer: Medicare Other | Admitting: *Deleted

## 2019-03-28 ENCOUNTER — Other Ambulatory Visit: Payer: Self-pay

## 2019-03-28 DIAGNOSIS — I442 Atrioventricular block, complete: Secondary | ICD-10-CM

## 2019-03-28 DIAGNOSIS — I495 Sick sinus syndrome: Secondary | ICD-10-CM

## 2019-03-29 LAB — CUP PACEART REMOTE DEVICE CHECK
Date Time Interrogation Session: 20200513073213
Implantable Lead Implant Date: 19980305
Implantable Lead Implant Date: 19980305
Implantable Lead Location: 753859
Implantable Lead Location: 753860
Implantable Pulse Generator Implant Date: 20140218
Pulse Gen Model: 2210
Pulse Gen Serial Number: 7448063

## 2019-04-11 NOTE — Progress Notes (Signed)
Remote pacemaker transmission.   

## 2019-04-18 ENCOUNTER — Telehealth: Payer: Self-pay | Admitting: *Deleted

## 2019-04-18 ENCOUNTER — Encounter: Payer: Self-pay | Admitting: *Deleted

## 2019-04-18 NOTE — Telephone Encounter (Signed)
Daughter Ancil Boozer called. Dr. Carlyle Lipa office has not received request for labs to be done during patient's appt with Dr. Felipa Eth. Refaxed request for CBC w/diff, CMP, and LDH to be drawn at patient's appt with Dr. Felipa Eth on 6/23 with request to fax results to Dr.Kale's office [phone]. Faxed Nanda Quinton [phone] as per Ms.Archer's request.  Fax confirmation received. Patient has appt with Dr. Irene Limbo on 7/10 to review labs.

## 2019-05-24 ENCOUNTER — Telehealth: Payer: Self-pay | Admitting: *Deleted

## 2019-05-24 NOTE — Telephone Encounter (Signed)
Attempted to contact patient's daughter. Dr. Irene Limbo ok with changing Friday 7/10 appt to a Phone appt. Patient's labs from PCP in [place]. LVM for daughter with above information. Schedule message sent.

## 2019-05-25 NOTE — Progress Notes (Signed)
HEMATOLOGY/ONCOLOGY CLINIC NOTE  Date of Service: 05/26/2019  Patient Care Team: Lajean Manes, MD as PCP - General (Internal Medicine) Croitoru, Dani Gobble, MD as PCP - Cardiology (Cardiology)  CHIEF COMPLAINTS/PURPOSE OF CONSULTATION:  Small Lymphocytic Lymphoma  HISTORY OF PRESENTING ILLNESS:   Kenneth Clay is a wonderful 85 y.o. male who has been referred to [place] by Dr. Lajean Manes for evaluation and management of Small Lymphocytic Lymphoma. He is accompanied today by his 3 daughters. The pt reports that he is doing well overall.   The pt's 3 daughters note that they share power of attorney together, as the patient has Alzheimer's dementia.  The pt's daughter notes that the first time the lump under his right arm was noticed was on 12/31/18, when she was washing under the patient's armpit. He notes that he is not in any pain. He denies feeling any differently currently as compared to 6-12 months ago. The pt and his family note that he is eating very well and hasn't lost any weight. The pt denies any new skin rashes. He denies abdominal pains.  The pt has a caretaker with him every day from 11am-6pm.  The pt's family notes that he has been taking Alzheimer's medication for more than 10 years, and that he has become a "little more aggressive" recently.   Of note prior to the patient's visit today, pt has had a Mammogram completed on 01/09/19 with results revealing Ultrasound-guided biopsy of a single right axillary lymph node with specimens to be sent in formalin AND saline. I have discussed the findings and recommendations with the patient. Results were also provided in writing at the conclusion of the visit. If applicable, a reminder letter will be sent to the patient regarding the next appointment. BI-RADS CATEGORY  4: Suspicious.  Most recent lab results (08/30/18) of CBC w/diff is as follows: all values are WNL.  On review of systems, pt reports eating well, stable energy levels,  stable weight, lump in right armpit, and denies abdominal pains, skin rashes other lumps or bumps, and any other symptoms.  Interval History:   I connected with Kenneth Clay on 05/26/2019 at 10:40 AM EDT by telephone and verified that I am speaking with the correct person using two identifiers.  I discussed the limitations, risks, security and privacy concerns of performing an evaluation and management service by telemedicine and the availability of in-person appointments. I also discussed with the patient that there may be a patient responsible charge related to this service. The patient expressed understanding and agreed to proceed.   Other persons participating in the visit and their role in the encounter: Kenneth Clay, pt's daughter. Pt was not present during the call. Provider's location: my office at the [facility] is called today for management and evaluation of his SLL. The patient's last visit with [place] was on 01/24/19. The pt reports that he is doing well overall.  The pt's daughter reports that he has been doing well but is unaware of what is going on. They currently have 24-hr care for the patient, which the patient is adjusting to. No concerns of infections, fever, chills, or night sweats.  He has an almond-size lymph node on the right side of his neck and a smaller one on the left side of his neck. Smaller lymph node underneath right armpit. These do not bother him.   Recent labs of CBC with diff and CMP from 05/17/2019 indicate all values  WNL except WBC at 11.8k, lymph# at 4.80k, glucose 103 05/17/2019 LDH at 218  On review of systems, pt's daughter reports several small lymph nodes on the left side of neck and armpit, one almond-sized lymph node on right side of neck and denies concerns of infections, fever, chills, night sweats, and any other symptoms.   MEDICAL HISTORY:  Past Medical History:  Diagnosis Date  . CHB (complete heart block) ([facility])  09/03/2015  . Dementia ([place])   . Depression   . H/O cardiovascular stress test 04/30/2010   Persantine myoview-normal  . H/O: GI bleed   . Hyperlipidemia   . Hypertension   . LV dysfunction    last echo-04/30/10, EF 50-55%, impaired LV relaxation  . Pacemaker    new gen. last check with insert 01/03/13  . Sinus node dysfunction ([facility])   . Ulcer     SURGICAL HISTORY: Past Surgical History:  Procedure Laterality Date  . PACEMAKER GENERATOR CHANGE  2005   St. Jude Idenity ADxXL DR  . PACEMAKER GENERATOR CHANGE  01/03/2013   ST. Jude Accent DR RF  . PACEMAKER GENERATOR CHANGE N/A 01/03/2013   Procedure: PACEMAKER GENERATOR CHANGE;  Surgeon: Sanda Klein, MD;  Location: [facility];  Service: Cardiovascular;  Laterality: N/A;  . PACEMAKER INSERTION  1998  . ROTATOR CUFF REPAIR     bilateral  . sciatica     injection 06-2015  . US ECHOCARDIOGRAPHY  04/30/10   aortic root sclerotic,RV mildly dilated    SOCIAL HISTORY: Social History   Socioeconomic History  . Marital status: Married    Spouse name: [name]  . Number of children: 3  . Years of education: Not on file  . Highest education level: Not on file  Occupational History  . Occupation: Medical laboratory scientific officer: RETIRED  Social Needs  . Financial resource strain: Not on file  . Food insecurity    Worry: Not on file    Inability: Not on file  . Transportation needs    Medical: Not on file    Non-medical: Not on file  Tobacco Use  . Smoking status: Former Smoker    Quit date: 11/16/2004    Years since quitting: 14.5  . Smokeless tobacco: Never Used  Substance and Sexual Activity  . Alcohol use: No  . Drug use: No  . Sexual activity: Not on file  Lifestyle  . Physical activity    Days per week: Not on file    Minutes per session: Not on file  . Stress: Not on file  Relationships  . Social Herbalist on phone: Not on file    Gets together: Not on file    Attends religious service: Not on file     Active member of club or organization: Not on file    Attends meetings of clubs or organizations: Not on file    Relationship status: Not on file  . Intimate partner violence    Fear of current or ex partner: Not on file    Emotionally abused: Not on file    Physically abused: Not on file    Forced sexual activity: Not on file  Other Topics Concern  . Not on file  Social History Narrative  . Not on file    FAMILY HISTORY: Family History  Problem Relation Age of Onset  . Diabetes Mother   . Heart disease Father   . Diabetes Brother     ALLERGIES:  has No  Known Allergies.  MEDICATIONS:  Current Outpatient Medications  Medication Sig Dispense Refill  . amLODipine (NORVASC) 2.5 MG tablet Take 2.5 mg by mouth daily.    . Ascorbic Acid (VITAMIN C) 1000 MG tablet Take 1,000 mg by mouth daily.    Marland Kitchen atorvastatin (LIPITOR) 10 MG tablet Take 1 tablet (10 mg total) by mouth daily at 6 PM. 30 tablet 0  . benazepril (LOTENSIN) 40 MG tablet Take 1 tablet (40 mg total) by mouth daily. 30 tablet 11  . busPIRone (BUSPAR) 5 MG tablet Take 5 mg by mouth 2 (two) times daily.     . citalopram (CELEXA) 10 MG tablet Take 10 mg by mouth daily.    Marland Kitchen donepezil (ARICEPT) 10 MG tablet Take 1 tablet (10 mg total) by mouth at bedtime. 30 tablet 6  . memantine (NAMENDA) 10 MG tablet Take 1 tablet (10 mg total) by mouth 2 (two) times daily. 60 tablet 6  . pantoprazole (PROTONIX) 40 MG tablet Take 40 mg by mouth daily.      No current facility-administered medications for this visit.     REVIEW OF SYSTEMS:    A 10+ POINT REVIEW OF SYSTEMS WAS OBTAINED including neurology, dermatology, psychiatry, cardiac, respiratory, lymph, extremities, GI, GU, Musculoskeletal, constitutional, breasts, reproductive, HEENT.  All pertinent positives are noted in the HPI.  All others are negative.   PHYSICAL EXAMINATION: ECOG PERFORMANCE STATUS: 3 - Symptomatic, >50% confined to bed  . There were no vitals filed for this  visit. There were no vitals filed for this visit. .There is no height or weight on file to calculate BMI.  Phone Visit  LABORATORY DATA:  I have reviewed the data as listed  . CBC Latest Ref Rng & Units 01/13/2016 01/12/2016 07/13/2011  WBC 4.0 - 10.5 K/uL 10.8(H) 12.9(H) 9.9  Hemoglobin 13.0 - 17.0 g/dL 13.4 14.7 9.0(L)  Hematocrit 39.0 - 52.0 % 40.6 42.4 26.3(L)  Platelets 150 - 400 K/uL 337 320 243   . CBC    Component Value Date/Time   WBC 10.8 (H) 01/13/2016 0443   RBC 4.32 01/13/2016 0443   HGB 13.4 01/13/2016 0443   HCT 40.6 01/13/2016 0443   PLT 337 01/13/2016 0443   MCV 94.0 01/13/2016 0443   MCH 31.0 01/13/2016 0443   MCHC 33.0 01/13/2016 0443   RDW 13.4 01/13/2016 0443   LYMPHSABS 2.5 01/12/2016 1820   MONOABS 1.6 (H) 01/12/2016 1820   EOSABS 0.3 01/12/2016 1820   BASOSABS 0.0 01/12/2016 1820     . CMP Latest Ref Rng & Units 01/13/2016 01/12/2016 07/12/2011  Glucose 65 - 99 mg/dL 215(H) 117(H) 101(H)  BUN 6 - 20 mg/dL 14 13 12   Creatinine 0.61 - 1.24 mg/dL 0.90 0.94 0.79  Sodium 135 - 145 mmol/L 140 140 143  Potassium 3.5 - 5.1 mmol/L 3.4(L) 3.4(L) 3.6  Chloride 101 - 111 mmol/L 108 103 113(H)  CO2 22 - 32 mmol/L 21(L) 27 25  Calcium 8.9 - 10.3 mg/dL 9.0 9.6 8.6  Total Protein 6.5 - 8.1 g/dL 6.5 7.2 -  Total Bilirubin 0.3 - 1.2 mg/dL 0.7 1.0 -  Alkaline Phos 38 - 126 U/L 90 105 -  AST 15 - 41 U/L 38 58(H) -  ALT 17 - 63 U/L 38 47 -    08/30/18 CBC w/diff:     01/12/19 Right Axilla LN Biopsy:    RADIOGRAPHIC STUDIES: I have personally reviewed the radiological images as listed and agreed with the findings in the report.  No results found.  ASSESSMENT & PLAN:   85 y.o. male with  1. Small Lymphocytic Lymphoma Labs upon initial presentation from 08/30/18, blood counts all normal including WBC at 7.4k, with 31% lymphocytes 01/12/19 Right Axilla LN Biopsy which revealed Small Lymphocytic Lymphoma 01/09/19 Mammogram revealed Ultrasound-guided biopsy  of a single right axillary lymph node with specimens to be sent in formalin AND saline. I have discussed the findings and recommendations with the patient. Results were also provided in writing at the conclusion of the visit. If applicable, a reminder letter will be sent to the patient regarding the next appointment. BI-RADS CATEGORY  4: Suspicious.  PLAN -Discussed pt labwork from 05/17/2019; slightly increased lymphocytes. HGB and PLT normal. Blood chemistries stable, LDH stable -Have discussed with the patient's family the diagnosis of this indolent slow-moving lymphoma, and indications for treatment including constitutional symptoms, cytopenias, and threat of organ injury. Pt is not having symptoms nor cytopenias. -Discussed again that the patient's threshold for treatment is relatively higher in light of his other medical problems including Alzheimer's dementia, suggestive of a conservative approach which the pt's daughters and POA agree with -In light of a conservative approach, recommend watchful observation with labs and clinical examination, or his PCP Dr. Felipa Eth. Pt's family prefers this F/U and we will be available PRN - F/U with Dr. Felipa Eth for repeat labs every 6 months. Watch blood counts, blood chemistries, and LDH and do a physical exam. -Will see the pt back as needed   RTC with Dr Irene Limbo as needed   All of the patients questions were answered with apparent satisfaction. The patient knows to call the clinic with any problems, questions or concerns.  The total time spent in the appt was 15 minutes and more than 50% was on counseling and direct patient cares.    Sullivan Lone MD MS AAHIVMS [facility] Hematology/Oncology Physician [facility]  (Office):       [phone] (Work cell):  [phone] (Fax):           [phone]  05/26/2019 11:51 AM  I, Baldwin [place], am acting as a scribe for Dr. Sullivan Lone.   .I have reviewed the above documentation for  accuracy and completeness, and I agree with the above. Brunetta Genera MD

## 2019-05-26 ENCOUNTER — Inpatient Hospital Stay: Payer: Medicare Other | Attending: Hematology | Admitting: Hematology

## 2019-05-26 ENCOUNTER — Other Ambulatory Visit: Payer: Medicare Other

## 2019-05-26 ENCOUNTER — Telehealth: Payer: Self-pay | Admitting: Hematology

## 2019-05-26 DIAGNOSIS — G309 Alzheimer's disease, unspecified: Secondary | ICD-10-CM | POA: Insufficient documentation

## 2019-05-26 DIAGNOSIS — F0280 Dementia in other diseases classified elsewhere without behavioral disturbance: Secondary | ICD-10-CM | POA: Insufficient documentation

## 2019-05-26 DIAGNOSIS — C8300 Small cell B-cell lymphoma, unspecified site: Secondary | ICD-10-CM | POA: Diagnosis not present

## 2019-05-26 NOTE — Telephone Encounter (Signed)
Per 7/10 los RTC with Dr Irene Limbo as needed

## 2019-06-27 ENCOUNTER — Ambulatory Visit (INDEPENDENT_AMBULATORY_CARE_PROVIDER_SITE_OTHER): Payer: Medicare Other | Admitting: *Deleted

## 2019-06-27 DIAGNOSIS — I442 Atrioventricular block, complete: Secondary | ICD-10-CM

## 2019-06-27 DIAGNOSIS — I495 Sick sinus syndrome: Secondary | ICD-10-CM

## 2019-06-27 LAB — CUP PACEART REMOTE DEVICE CHECK
Battery Remaining Longevity: 56 mo
Battery Remaining Percentage: 51 %
Battery Voltage: 2.87 V
Brady Statistic AP VP Percent: 99 %
Brady Statistic AP VS Percent: 1 %
Brady Statistic AS VP Percent: 1 %
Brady Statistic AS VS Percent: 1 %
Brady Statistic RA Percent Paced: 99 %
Brady Statistic RV Percent Paced: 99 %
Date Time Interrogation Session: 20200811064810
Implantable Lead Implant Date: 19980305
Implantable Lead Implant Date: 19980305
Implantable Lead Location: 753859
Implantable Lead Location: 753860
Implantable Pulse Generator Implant Date: 20140218
Lead Channel Impedance Value: 460 Ohm
Lead Channel Impedance Value: 580 Ohm
Lead Channel Pacing Threshold Amplitude: 0.75 V
Lead Channel Pacing Threshold Amplitude: 1.125 V
Lead Channel Pacing Threshold Pulse Width: 0.4 ms
Lead Channel Pacing Threshold Pulse Width: 0.4 ms
Lead Channel Sensing Intrinsic Amplitude: 12 mV
Lead Channel Sensing Intrinsic Amplitude: 2.9 mV
Lead Channel Setting Pacing Amplitude: 1.375
Lead Channel Setting Pacing Amplitude: 2 V
Lead Channel Setting Pacing Pulse Width: 0.4 ms
Lead Channel Setting Sensing Sensitivity: 2 mV
Pulse Gen Model: 2210
Pulse Gen Serial Number: 7448063

## 2019-06-28 ENCOUNTER — Telehealth: Payer: Self-pay | Admitting: *Deleted

## 2019-06-28 NOTE — Telephone Encounter (Signed)
"  Tammy [phone]) with Dr. Felipa Eth.   calling for an appointment to see Dr. Irene Limbo.  symptoms are worse.  Rapidly increasing lymph nodes to left neck arm and hand.  Started three weeks ago.  No pain, fever or sweats.  Call his daughter Helene Kelp [phone]) with appointment.

## 2019-06-28 NOTE — Telephone Encounter (Signed)
Dr. Sherryll Burger office called requesting appointment with Dr. Irene Limbo. Office visit notes received by fax. Patient has rapidly increasing lymph nodes in left neck area, arm and hand, started three weeks ago. Dr. Felipa Eth and Dr. Irene Limbo conferred by phone.  Dr. Irene Limbo will see patient 8/20 at 3:20 pm. Contacted daughter Ancil Boozer with time of appt. She verbalized understanding and asked permission to accompany father.

## 2019-06-29 NOTE — Telephone Encounter (Signed)
Received permission for daughter to accompany father to appt 8/20. Contacted Ms. Fernande Boyden - she verbalized understanding.

## 2019-07-05 NOTE — Progress Notes (Signed)
HEMATOLOGY/ONCOLOGY CLINIC NOTE  Date of Service: 07/05/2019  Patient Care Team: Lajean Manes, MD as PCP - General (Internal Medicine) Croitoru, Dani Gobble, MD as PCP - Cardiology (Cardiology)  CHIEF COMPLAINTS/PURPOSE OF CONSULTATION:  Small Lymphocytic Lymphoma  HISTORY OF PRESENTING ILLNESS:   Kenneth Clay is a wonderful 85 y.o. male who has been referred to [place] by Dr. Lajean Manes for evaluation and management of Small Lymphocytic Lymphoma. He is accompanied today by his 3 daughters. The pt reports that he is doing well overall.   The pt's 3 daughters note that they share power of attorney together, as the patient has Alzheimer's dementia.  The pt's daughter notes that the first time the lump under his right arm was noticed was on 12/31/18, when she was washing under the patient's armpit. He notes that he is not in any pain. He denies feeling any differently currently as compared to 6-12 months ago. The pt and his family note that he is eating very well and hasn't lost any weight. The pt denies any new skin rashes. He denies abdominal pains.  The pt has a caretaker with him every day from 11am-6pm.  The pt's family notes that he has been taking Alzheimer's medication for more than 10 years, and that he has become a "little more aggressive" recently.   Of note prior to the patient's visit today, pt has had a Mammogram completed on 01/09/19 with results revealing Ultrasound-guided biopsy of a single right axillary lymph node with specimens to be sent in formalin AND saline. I have discussed the findings and recommendations with the patient. Results were also provided in writing at the conclusion of the visit. If applicable, a reminder letter will be sent to the patient regarding the next appointment. BI-RADS CATEGORY  4: Suspicious.  Most recent lab results (08/30/18) of CBC w/diff is as follows: all values are WNL.  On review of systems, pt reports eating well, stable energy levels,  stable weight, lump in right armpit, and denies abdominal pains, skin rashes other lumps or bumps, and any other symptoms.  Interval History:   Kenneth Clay is is here for management and evaluation of his SLL. The patient's last visit with [place] was on 05/26/2019. The pt reports that he is doing well overall. He was accompanied by on of his daughters, in person and another daughter, by phone.  The pt reports enlarged cervical and axillary lymph nodes that have increased significantly in the past few months.Pts left arm and hand has swollen significantly. No scans have been performed. The pt does not feel any pain due to enlarged lymph nodes and swollen hand.   On review of systems, pt reports weight loss, enlarged abdomen and denies abdominal pain, leg swelling, SOB, fever, chills, night sweats and any other symptoms.    MEDICAL HISTORY:  Past Medical History:  Diagnosis Date  . CHB (complete heart block) ([place]) 09/03/2015  . Dementia ([place])   . Depression   . H/O cardiovascular stress test 04/30/2010   Persantine myoview-normal  . H/O: GI bleed   . Hyperlipidemia   . Hypertension   . LV dysfunction    last echo-04/30/10, EF 50-55%, impaired LV relaxation  . Pacemaker    new gen. last check with insert 01/03/13  . Sinus node dysfunction ([facility])   . Ulcer     SURGICAL HISTORY: Past Surgical History:  Procedure Laterality Date  . PACEMAKER GENERATOR CHANGE  2005   St. Jude Idenity ADxXL DR  .  PACEMAKER GENERATOR CHANGE  01/03/2013   ST. Jude Accent DR RF  . PACEMAKER GENERATOR CHANGE N/A 01/03/2013   Procedure: PACEMAKER GENERATOR CHANGE;  Surgeon: Sanda Klein, MD;  Location: [facility];  Service: Cardiovascular;  Laterality: N/A;  . PACEMAKER INSERTION  1998  . ROTATOR CUFF REPAIR     bilateral  . sciatica     injection 06-2015  . US ECHOCARDIOGRAPHY  04/30/10   aortic root sclerotic,RV mildly dilated    SOCIAL HISTORY: Social History   Socioeconomic History  . Marital  status: Married    Spouse name: [name]  . Number of children: 3  . Years of education: Not on file  . Highest education level: Not on file  Occupational History  . Occupation: Medical laboratory scientific officer: RETIRED  Social Needs  . Financial resource strain: Not on file  . Food insecurity    Worry: Not on file    Inability: Not on file  . Transportation needs    Medical: Not on file    Non-medical: Not on file  Tobacco Use  . Smoking status: Former Smoker    Quit date: 11/16/2004    Years since quitting: 14.6  . Smokeless tobacco: Never Used  Substance and Sexual Activity  . Alcohol use: No  . Drug use: No  . Sexual activity: Not on file  Lifestyle  . Physical activity    Days per week: Not on file    Minutes per session: Not on file  . Stress: Not on file  Relationships  . Social Herbalist on phone: Not on file    Gets together: Not on file    Attends religious service: Not on file    Active member of club or organization: Not on file    Attends meetings of clubs or organizations: Not on file    Relationship status: Not on file  . Intimate partner violence    Fear of current or ex partner: Not on file    Emotionally abused: Not on file    Physically abused: Not on file    Forced sexual activity: Not on file  Other Topics Concern  . Not on file  Social History Narrative  . Not on file    FAMILY HISTORY: Family History  Problem Relation Age of Onset  . Diabetes Mother   . Heart disease Father   . Diabetes Brother     ALLERGIES:  has No Known Allergies.  MEDICATIONS:  Current Outpatient Medications  Medication Sig Dispense Refill  . amLODipine (NORVASC) 2.5 MG tablet Take 2.5 mg by mouth daily.    . Ascorbic Acid (VITAMIN C) 1000 MG tablet Take 1,000 mg by mouth daily.    Marland Kitchen atorvastatin (LIPITOR) 10 MG tablet Take 1 tablet (10 mg total) by mouth daily at 6 PM. 30 tablet 0  . benazepril (LOTENSIN) 40 MG tablet Take 1 tablet (40 mg total) by mouth  daily. 30 tablet 11  . busPIRone (BUSPAR) 5 MG tablet Take 5 mg by mouth 2 (two) times daily.     . citalopram (CELEXA) 10 MG tablet Take 10 mg by mouth daily.    Marland Kitchen donepezil (ARICEPT) 10 MG tablet Take 1 tablet (10 mg total) by mouth at bedtime. 30 tablet 6  . memantine (NAMENDA) 10 MG tablet Take 1 tablet (10 mg total) by mouth 2 (two) times daily. 60 tablet 6  . pantoprazole (PROTONIX) 40 MG tablet Take 40 mg by mouth daily.  No current facility-administered medications for this visit.     REVIEW OF SYSTEMS:    A 10+ POINT REVIEW OF SYSTEMS WAS OBTAINED including neurology, dermatology, psychiatry, cardiac, respiratory, lymph, extremities, GI, GU, Musculoskeletal, constitutional, breasts, reproductive, HEENT.  All pertinent positives are noted in the HPI.  All others are negative.   PHYSICAL EXAMINATION: ECOG PERFORMANCE STATUS: 3 - Symptomatic, >50% confined to bed  . Vitals:   07/06/19 1607  BP: (!) 133/57  Pulse: 75  Resp: 18  Temp: 99.1 F (37.3 C)  SpO2: 98%   Filed Weights   07/06/19 1607  Weight: 174 lb 14.4 oz (79.3 kg)   .Body mass index is 29.1 kg/m.  GENERAL:alert, in no acute distress and comfortable SKIN: no acute rashes, no significant lesions EYES: conjunctiva are pink and non-injected, sclera anicteric OROPHARYNX: MMM, no exudates, no oropharyngeal erythema or ulceration NECK: supple, no JVD LYMPH: B/L cervical & axillary lymphadenopathy, 1-2 cm sized lymph nodes in the right inguinal region.  LUNGS: clear to auscultation b/l with normal respiratory effort HEART: regular rate & rhythm ABDOMEN:  normoactive bowel sounds , non tender, not distended. No palpable hepatosplenomegaly.  Extremity: no pedal edema PSYCH: alert & oriented x 3 with fluent speech NEURO: no focal motor/sensory deficits    LABORATORY DATA:  I have reviewed the data as listed  . CBC Latest Ref Rng & Units 01/13/2016 01/12/2016 07/13/2011  WBC 4.0 - 10.5 K/uL 10.8(H) 12.9(H)  9.9  Hemoglobin 13.0 - 17.0 g/dL 13.4 14.7 9.0(L)  Hematocrit 39.0 - 52.0 % 40.6 42.4 26.3(L)  Platelets 150 - 400 K/uL 337 320 243   . CBC    Component Value Date/Time   WBC 10.8 (H) 01/13/2016 0443   RBC 4.32 01/13/2016 0443   HGB 13.4 01/13/2016 0443   HCT 40.6 01/13/2016 0443   PLT 337 01/13/2016 0443   MCV 94.0 01/13/2016 0443   MCH 31.0 01/13/2016 0443   MCHC 33.0 01/13/2016 0443   RDW 13.4 01/13/2016 0443   LYMPHSABS 2.5 01/12/2016 1820   MONOABS 1.6 (H) 01/12/2016 1820   EOSABS 0.3 01/12/2016 1820   BASOSABS 0.0 01/12/2016 1820     . CMP Latest Ref Rng & Units 01/13/2016 01/12/2016 07/12/2011  Glucose 65 - 99 mg/dL 215(H) 117(H) 101(H)  BUN 6 - 20 mg/dL 14 13 12   Creatinine 0.61 - 1.24 mg/dL 0.90 0.94 0.79  Sodium 135 - 145 mmol/L 140 140 143  Potassium 3.5 - 5.1 mmol/L 3.4(L) 3.4(L) 3.6  Chloride 101 - 111 mmol/L 108 103 113(H)  CO2 22 - 32 mmol/L 21(L) 27 25  Calcium 8.9 - 10.3 mg/dL 9.0 9.6 8.6  Total Protein 6.5 - 8.1 g/dL 6.5 7.2 -  Total Bilirubin 0.3 - 1.2 mg/dL 0.7 1.0 -  Alkaline Phos 38 - 126 U/L 90 105 -  AST 15 - 41 U/L 38 58(H) -  ALT 17 - 63 U/L 38 47 -    08/30/18 CBC w/diff:     01/12/19 Right Axilla LN Biopsy:    RADIOGRAPHIC STUDIES: I have personally reviewed the radiological images as listed and agreed with the findings in the report. No results found.  ASSESSMENT & PLAN:   85 y.o. male with significant dementia with conservative goals of care   1. Small Lymphocytic Lymphoma Labs upon initial presentation from 08/30/18, blood counts all normal including WBC at 7.4k, with 31% lymphocytes 01/12/19 Right Axilla LN Biopsy which revealed Small Lymphocytic Lymphoma 01/09/19 Mammogram revealed Ultrasound-guided biopsy of a single  right axillary lymph node with specimens to be sent in formalin AND saline. I have discussed the findings and recommendations with the patient. Results were also provided in writing at the conclusion of the visit.  If applicable, a reminder letter will be sent to the patient regarding the next appointment. BI-RADS CATEGORY  4: Suspicious.  PLAN -Discussed the patient's options for treatment at this time: complete a full work up with LN biopsy, U/S of left forearm, PET scan, and repeat labs to determine whether SLL has progressed or undergone a large cell transformation OR monitor with labs overtime -The pt is not experiencing any pain or discomfort at this time and does not see a reason to initiate treatment. However, he may not fully understand the situation due to his dementia. He does have increasing bulky neck and axillary LNadenopathy -The pt's daughters will follow up with a decision regarding the pt's goals of care.  RTC with Dr Irene Limbo and future plan based on goals of care.  All of the patients questions were answered with apparent satisfaction. The patient knows to call the clinic with any problems, questions or concerns.  The total time spent in the appt was 30 minutes and more than 50% was on counseling and direct patient cares.   Sullivan Lone MD MS AAHIVMS [facility] Hematology/Oncology Physician [facility]  (Office):       [phone] (Work cell):  [phone] (Fax):           [phone]  07/05/2019 9:17 AM  I, De Burrs, am acting as a scribe for Dr. Irene Limbo  .I have reviewed the above documentation for accuracy and completeness, and I agree with the above. Brunetta Genera MD  ADDENDUM  Daughter called and notes that family wants to proceed with PET/CT --ordered

## 2019-07-06 ENCOUNTER — Inpatient Hospital Stay: Payer: Medicare Other | Attending: Hematology | Admitting: Hematology

## 2019-07-06 ENCOUNTER — Other Ambulatory Visit: Payer: Self-pay

## 2019-07-06 VITALS — BP 133/57 | HR 75 | Temp 99.1°F | Resp 18 | Ht 65.0 in | Wt 174.9 lb

## 2019-07-06 DIAGNOSIS — E785 Hyperlipidemia, unspecified: Secondary | ICD-10-CM | POA: Diagnosis not present

## 2019-07-06 DIAGNOSIS — F329 Major depressive disorder, single episode, unspecified: Secondary | ICD-10-CM | POA: Insufficient documentation

## 2019-07-06 DIAGNOSIS — F0280 Dementia in other diseases classified elsewhere without behavioral disturbance: Secondary | ICD-10-CM | POA: Insufficient documentation

## 2019-07-06 DIAGNOSIS — I10 Essential (primary) hypertension: Secondary | ICD-10-CM | POA: Diagnosis not present

## 2019-07-06 DIAGNOSIS — Z79899 Other long term (current) drug therapy: Secondary | ICD-10-CM | POA: Insufficient documentation

## 2019-07-06 DIAGNOSIS — I519 Heart disease, unspecified: Secondary | ICD-10-CM | POA: Diagnosis not present

## 2019-07-06 DIAGNOSIS — C8300 Small cell B-cell lymphoma, unspecified site: Secondary | ICD-10-CM | POA: Insufficient documentation

## 2019-07-06 DIAGNOSIS — G309 Alzheimer's disease, unspecified: Secondary | ICD-10-CM | POA: Diagnosis not present

## 2019-07-06 DIAGNOSIS — I442 Atrioventricular block, complete: Secondary | ICD-10-CM | POA: Insufficient documentation

## 2019-07-06 NOTE — Progress Notes (Signed)
Remote pacemaker transmission.   

## 2019-07-07 ENCOUNTER — Telehealth: Payer: Self-pay | Admitting: Hematology

## 2019-07-07 NOTE — Telephone Encounter (Signed)
Per 8/20 los RTC with Dr Irene Limbo and future plan based on goals of care.

## 2019-07-10 ENCOUNTER — Telehealth: Payer: Self-pay | Admitting: *Deleted

## 2019-07-10 NOTE — Telephone Encounter (Signed)
Patient's daughter called. Family/patient would like to patient to have PET scan as discussed at last appt.

## 2019-07-10 NOTE — Telephone Encounter (Signed)
Dr. Irene Limbo notified of family request. Order to be placed today. Contacted Ms. Fernande Boyden (daughter) and informed her and gave number for [facility]. She verbalized understanding.

## 2019-07-11 ENCOUNTER — Other Ambulatory Visit: Payer: Self-pay | Admitting: Hematology

## 2019-07-11 DIAGNOSIS — C8300 Small cell B-cell lymphoma, unspecified site: Secondary | ICD-10-CM

## 2019-07-12 ENCOUNTER — Telehealth: Payer: Self-pay | Admitting: *Deleted

## 2019-07-12 NOTE — Telephone Encounter (Signed)
Daughter Ancil Boozer called. PET scheduled on 9/3. She requested the following med be added to patient's med list: Lorazepam, 0.5mg  tablet, take 1/2 tablet daily as needed for agitation. She reports he rarely takes it but wanted it on the list. Med added.

## 2019-07-19 ENCOUNTER — Telehealth: Payer: Self-pay | Admitting: *Deleted

## 2019-07-19 NOTE — Telephone Encounter (Signed)
Daughter called, LVM:  PET originally scheduled for 9/3 has been rescheduled for 9/9 as authorization not returned yet. [employer] rep stated clinicals have been sent to insurerer - awaiting decision.  Contacted daughter to acknowledge receipt of info and provide update r/t auth.

## 2019-07-20 ENCOUNTER — Ambulatory Visit (HOSPITAL_COMMUNITY): Payer: Medicare Other

## 2019-07-26 ENCOUNTER — Other Ambulatory Visit: Payer: Self-pay

## 2019-07-26 ENCOUNTER — Ambulatory Visit (HOSPITAL_COMMUNITY)
Admission: RE | Admit: 2019-07-26 | Discharge: 2019-07-26 | Disposition: A | Discharge status: Home / Self Care | Payer: Medicare Other | Source: Ambulatory Visit | Attending: Hematology | Admitting: Hematology

## 2019-07-26 DIAGNOSIS — E041 Nontoxic single thyroid nodule: Secondary | ICD-10-CM | POA: Insufficient documentation

## 2019-07-26 DIAGNOSIS — C8300 Small cell B-cell lymphoma, unspecified site: Secondary | ICD-10-CM | POA: Insufficient documentation

## 2019-07-26 DIAGNOSIS — Z79899 Other long term (current) drug therapy: Secondary | ICD-10-CM | POA: Insufficient documentation

## 2019-07-26 LAB — GLUCOSE, CAPILLARY: Glucose-Capillary: 109 mg/dL — ABNORMAL HIGH (ref 70–99)

## 2019-07-26 MED ORDER — FLUDEOXYGLUCOSE F - 18 (FDG) INJECTION
8.6700 | Freq: Once | INTRAVENOUS | Status: AC
  Administered 2019-07-26: 8.67 via INTRAVENOUS

## 2019-08-04 ENCOUNTER — Inpatient Hospital Stay: Payer: Medicare Other | Attending: Hematology | Admitting: Hematology

## 2019-08-04 DIAGNOSIS — Z7189 Other specified counseling: Secondary | ICD-10-CM | POA: Diagnosis not present

## 2019-08-04 DIAGNOSIS — C8300 Small cell B-cell lymphoma, unspecified site: Secondary | ICD-10-CM

## 2019-08-04 NOTE — Progress Notes (Signed)
HEMATOLOGY/ONCOLOGY CLINIC NOTE  Date of Service: 08/04/2019  Patient Care Team: Lajean Manes, MD as PCP - General (Internal Medicine) Croitoru, Dani Gobble, MD as PCP - Cardiology (Cardiology)  CHIEF COMPLAINTS/PURPOSE OF CONSULTATION:  Small Lymphocytic Lymphoma  HISTORY OF PRESENTING ILLNESS:   Kenneth Clay is a wonderful 85 y.o. male who has been referred to [place] by Dr. Lajean Manes for evaluation and management of Small Lymphocytic Lymphoma. He is accompanied today by his 3 daughters. The pt reports that he is doing well overall.   The pt's 3 daughters note that they share power of attorney together, as the patient has Alzheimer's dementia.  The pt's daughter notes that the first time the lump under his right arm was noticed was on 12/31/18, when she was washing under the patient's armpit. He notes that he is not in any pain. He denies feeling any differently currently as compared to 6-12 months ago. The pt and his family note that he is eating very well and hasn't lost any weight. The pt denies any new skin rashes. He denies abdominal pains.  The pt has a caretaker with him every day from 11am-6pm.  The pt's family notes that he has been taking Alzheimer's medication for more than 10 years, and that he has become a "little more aggressive" recently.   Of note prior to the patient's visit today, pt has had a Mammogram completed on 01/09/19 with results revealing Ultrasound-guided biopsy of a single right axillary lymph node with specimens to be sent in formalin AND saline. I have discussed the findings and recommendations with the patient. Results were also provided in writing at the conclusion of the visit. If applicable, a reminder letter will be sent to the patient regarding the next appointment. BI-RADS CATEGORY  4: Suspicious.  Most recent lab results (08/30/18) of CBC w/diff is as follows: all values are WNL.  On review of systems, pt reports eating well, stable energy levels,  stable weight, lump in right armpit, and denies abdominal pains, skin rashes other lumps or bumps, and any other symptoms.   Interval History:   Kenneth Clay is is here for management and evaluation of his SLL. The patient's last visit with [place] was on 06/18/2019. The patient's information was reported to and by his daughter, who holds HCPOA. Patient with severe dementia and cannot meaningfully participate in the discussion.  The pt's daughter notes that his neck and arm continue to remain swelling. He believes that the edema in his arm are stable, but that the edema in his neck is worse.   Of note since the patient's last visit, pt has had a PET scan completed on 07/26/2019 with results revealing "Bulky hypermetabolic lymphadenopathy throughout the neck, chest, abdomen, and pelvis, consistent with non-Hodgkin lymphoma. (Deauville score 5)  1.6 cm hypermetabolic right thyroid lobe nodule. Malignancy cannot be excluded. Further evaluation with thyroid ultrasound is recommended. This follows ACR consensus guidelines: Managing Incidental Thyroid Nodules Detected on Imaging: White Paper of the ACR Incidental Thyroid Findings Committee. J Am Coll Radiol 2015; 12:143-150.".  On review of systems, pt reports edema, vocal changes, and denies any other symptoms.     MEDICAL HISTORY:  Past Medical History:  Diagnosis Date   CHB (complete heart block) ([place]) 09/03/2015   Dementia ([place])    Depression    H/O cardiovascular stress test 04/30/2010   Persantine myoview-normal   H/O: GI bleed    Hyperlipidemia    Hypertension    LV dysfunction  last echo-04/30/10, EF 50-55%, impaired LV relaxation   Pacemaker    new gen. last check with insert 01/03/13   Sinus node dysfunction ([place])    Ulcer     SURGICAL HISTORY: Past Surgical History:  Procedure Laterality Date   PACEMAKER GENERATOR CHANGE  2005   [employer]. Jude Idenity ADxXL DR   PACEMAKER GENERATOR CHANGE  01/03/2013   ST. Jude Accent  DR RF   PACEMAKER GENERATOR CHANGE N/A 01/03/2013   Procedure: PACEMAKER GENERATOR CHANGE;  Surgeon: Sanda Klein, MD;  Location: [facility]ATH [facility];  Service: Cardiovascular;  Laterality: N/A;   PACEMAKER INSERTION  1998   ROTATOR CUFF REPAIR     bilateral   sciatica     injection 06-2015   US ECHOCARDIOGRAPHY  04/30/10   aortic root sclerotic,RV mildly dilated    SOCIAL HISTORY: Social History   Socioeconomic History   Marital status: Married    Spouse name: [name]   Number of children: 3   Years of education: Not on file   Highest education level: Not on file  Occupational History   Occupation: Medical laboratory scientific officer: RETIRED  Social Designer, fashion/clothing strain: Not on file   Food insecurity    Worry: Not on file    Inability: Not on file   Transportation needs    Medical: Not on file    Non-medical: Not on file  Tobacco Use   Smoking status: Former Smoker    Quit date: 11/16/2004    Years since quitting: 14.7   Smokeless tobacco: Never Used  Substance and Sexual Activity   Alcohol use: No   Drug use: No   Sexual activity: Not on file  Lifestyle   Physical activity    Days per week: Not on file    Minutes per session: Not on file   Stress: Not on file  Relationships   Social connections    Talks on phone: Not on file    Gets together: Not on file    Attends religious service: Not on file    Active member of club or organization: Not on file    Attends meetings of clubs or organizations: Not on file    Relationship status: Not on file   Intimate partner violence    Fear of current or ex partner: Not on file    Emotionally abused: Not on file    Physically abused: Not on file    Forced sexual activity: Not on file  Other Topics Concern   Not on file  Social History Narrative   Not on file    FAMILY HISTORY: Family History  Problem Relation Age of Onset   Diabetes Mother    Heart disease Father    Diabetes Brother      ALLERGIES:  has No Known Allergies.  MEDICATIONS:  Current Outpatient Medications  Medication Sig Dispense Refill   amLODipine (NORVASC) 2.5 MG tablet Take 2.5 mg by mouth daily.     Ascorbic Acid (VITAMIN C) 1000 MG tablet Take 1,000 mg by mouth daily.     atorvastatin (LIPITOR) 10 MG tablet Take 1 tablet (10 mg total) by mouth daily at 6 PM. 30 tablet 0   benazepril (LOTENSIN) 40 MG tablet Take 1 tablet (40 mg total) by mouth daily. 30 tablet 11   busPIRone (BUSPAR) 5 MG tablet Take 5 mg by mouth 2 (two) times daily.      citalopram (CELEXA) 10 MG tablet Take 10 mg by  mouth daily.     donepezil (ARICEPT) 10 MG tablet Take 1 tablet (10 mg total) by mouth at bedtime. 30 tablet 6   LORazepam (ATIVAN) 0.5 MG tablet TAKE 1 2 (ONE HALF) TABLET BY MOUTH ONCE DAILY AS NEEDED UPON AGITATION     memantine (NAMENDA) 10 MG tablet Take 1 tablet (10 mg total) by mouth 2 (two) times daily. 60 tablet 6   pantoprazole (PROTONIX) 40 MG tablet Take 40 mg by mouth daily.      No current facility-administered medications for this visit.     REVIEW OF SYSTEMS:   A 10+ POINT REVIEW OF SYSTEMS WAS OBTAINED including neurology, dermatology, psychiatry, cardiac, respiratory, lymph, extremities, GI, GU, Musculoskeletal, constitutional, breasts, reproductive, HEENT.  All pertinent positives are noted in the HPI.  All others are negative.    PHYSICAL EXAMINATION: ECOG FS:3 - Symptomatic, >50% confined to bed  There were no vitals filed for this visit. Wt Readings from Last 3 Encounters:  07/06/19 174 lb 14.4 oz (79.3 kg)  01/24/19 180 lb (81.6 kg)  10/19/18 185 lb 12.8 oz (84.3 kg)   There is no height or weight on file to calculate BMI.    Telehealth Visit 08/04/19     LABORATORY DATA:  I have reviewed the data as listed  . CBC Latest Ref Rng & Units 01/13/2016 01/12/2016 07/13/2011  WBC 4.0 - 10.5 K/uL 10.8(H) 12.9(H) 9.9  Hemoglobin 13.0 - 17.0 g/dL 13.4 14.7 9.0(L)  Hematocrit 39.0  - 52.0 % 40.6 42.4 26.3(L)  Platelets 150 - 400 K/uL 337 320 243   . CBC    Component Value Date/Time   WBC 10.8 (H) 01/13/2016 0443   RBC 4.32 01/13/2016 0443   HGB 13.4 01/13/2016 0443   HCT 40.6 01/13/2016 0443   PLT 337 01/13/2016 0443   MCV 94.0 01/13/2016 0443   MCH 31.0 01/13/2016 0443   MCHC 33.0 01/13/2016 0443   RDW 13.4 01/13/2016 0443   LYMPHSABS 2.5 01/12/2016 1820   MONOABS 1.6 (H) 01/12/2016 1820   EOSABS 0.3 01/12/2016 1820   BASOSABS 0.0 01/12/2016 1820     . CMP Latest Ref Rng & Units 01/13/2016 01/12/2016 07/12/2011  Glucose 65 - 99 mg/dL 215(H) 117(H) 101(H)  BUN 6 - 20 mg/dL 14 13 12   Creatinine 0.61 - 1.24 mg/dL 0.90 0.94 0.79  Sodium 135 - 145 mmol/L 140 140 143  Potassium 3.5 - 5.1 mmol/L 3.4(L) 3.4(L) 3.6  Chloride 101 - 111 mmol/L 108 103 113(H)  CO2 22 - 32 mmol/L 21(L) 27 25  Calcium 8.9 - 10.3 mg/dL 9.0 9.6 8.6  Total Protein 6.5 - 8.1 g/dL 6.5 7.2 -  Total Bilirubin 0.3 - 1.2 mg/dL 0.7 1.0 -  Alkaline Phos 38 - 126 U/L 90 105 -  AST 15 - 41 U/L 38 58(H) -  ALT 17 - 63 U/L 38 47 -    08/30/18 CBC w/diff:     01/12/19 Right Axilla LN Biopsy:    RADIOGRAPHIC STUDIES: I have personally reviewed the radiological images as listed and agreed with the findings in the report. Nm Pet Image Initial (pi) Skull Base To Thigh  Result Date: 07/26/2019 CLINICAL DATA:  Initial treatment strategy for small lymphocytic lymphoma. EXAM: NUCLEAR MEDICINE PET SKULL BASE TO THIGH TECHNIQUE: 8.7 mCi F-18 FDG was injected intravenously. Full-ring PET imaging was performed from the skull base to thigh after the radiotracer. CT data was obtained and used for attenuation correction and anatomic localization. Fasting blood glucose:  109 mg/dl COMPARISON:  None. FINDINGS: Mediastinal blood-pool activity (background): SUV max = 2.3 Liver activity (reference): SUV max = 3.6 NECK: Bulky hypermetabolic lymphadenopathy is seen bilaterally at all cervical stations. Largest  index lymph node mass located on the right at level 2B measures 5.6 x 3.4 cm, and has SUV max of 8.3. A 1.6 cm low-attenuation right thyroid lobe nodule is seen on image 1544, which shows mild hypermetabolism with SUV max of 3.4. Incidental CT findings:  None. CHEST: Bulky hypermetabolic lymphadenopathy is seen in the axillary and subpectoral regions bilaterally. Largest index lymph node in the right axilla measures 6.1 x 4.3 cm on image 92/4, and has SUV max of 10.7. No hypermetabolic mediastinal or hilar lymph nodes are identified. No suspicious pulmonary nodules or masses are identified. Incidental CT findings: Mild-to-moderate centrilobular emphysema. ABDOMEN/PELVIS: No abnormal hypermetabolic activity within the liver, pancreas, adrenal glands, or spleen. Bulky abdominal retroperitoneal lymphadenopathy is seen in casing the aorta and IVC. This conglomerate mass measures 11.0 x 6.7 cm on image 135/4, with SUV max 8.4. Mildly hypermetabolic upper abdominal lymphadenopathy is seen in the gastrohepatic ligament and porta hepatis. Bulky hypermetabolic lymphadenopathy is also seen throughout iliac lymph node chains bilaterally. Largest index lymph node in the right external iliac chain measures 5.6 x 3.5 cm on image 177/4, with SUV max of 11.2. Mild hypermetabolic lymphadenopathy is also seen in the inguinal regions bilaterally. Incidental CT findings: Enlarged prostate, and mild diffuse bladder wall thickening likely due to chronic bladder outlet obstruction. Diverticulosis is seen mainly involving the sigmoid colon, however there is no evidence of diverticulitis. Aortic atherosclerosis. SKELETON: No focal hypermetabolic bone lesions to suggest skeletal metastasis. Incidental CT findings:  None. IMPRESSION: Bulky hypermetabolic lymphadenopathy throughout the neck, chest, abdomen, and pelvis, consistent with non-Hodgkin lymphoma. (Deauville score 5) 1.6 cm hypermetabolic right thyroid lobe nodule. Malignancy cannot  be excluded. Further evaluation with thyroid ultrasound is recommended. This follows ACR consensus guidelines: Managing Incidental Thyroid Nodules Detected on Imaging: White Paper of the ACR Incidental Thyroid Findings Committee. J Am Coll Radiol 2015; 12:143-150. Electronically Signed   By: Marlaine Hind M.D.   On: 07/26/2019 16:11    ASSESSMENT & PLAN:   85 y.o. male with significant dementia with conservative goals of care   1. Small Lymphocytic Lymphoma Labs upon initial presentation from 08/30/18, blood counts all normal including WBC at 7.4k, with 31% lymphocytes 01/12/19 Right Axilla LN Biopsy which revealed Small Lymphocytic Lymphoma 01/09/19 Mammogram revealed Ultrasound-guided biopsy of a single right axillary lymph node with specimens to be sent in formalin AND saline. I have discussed the findings and recommendations with the patient. Results were also provided in writing at the conclusion of the visit. If applicable, a reminder letter will be sent to the patient regarding the next appointment. BI-RADS CATEGORY  4: Suspicious.  2. Severe End stage dementia  PLAN -Discussed 07/26/2019 PET scan with results revealing "Bulky hypermetabolic lymphadenopathy throughout the neck, chest, abdomen, and pelvis, consistent with non-Hodgkin lymphoma. (Deauville score 5)  1.6 cm hypermetabolic right thyroid lobe nodule. Malignancy cannot be excluded. Further evaluation with thyroid ultrasound is recommended. This follows ACR consensus guidelines: Managing Incidental Thyroid Nodules Detected on Imaging: White Paper of the ACR Incidental Thyroid Findings Committee. J Am Coll Radiol 2015; 12:143-150." -discussed goals of care in details with the patients daughter teresa who is his HCPOA. --Recommending biopsy of lymph node to confirm whether this still represents CLL/SLL and to r/o large cell transformation in the setting of more  rapidly growing lymph nodes should the family determine they want disease  specific treatment recommendations.  -Discussed potential treatment options, including the risks and benefits associated with those treatment options. Options including: steroids and venetoclax -At this time, it is unclear if his lymphoma has changed, but histologic transformation cannot be clinically ruled out. -The option for comfort care is available for the family should they decide against treatment. -At this time, his family is going to decide on what route to take with his treatment and will get back to [place] at the cancer center   FOLLOW UP: Patients daughter HCPOA to call to determine treatment approach comfort cares through hospice vs prednisone.  The total time spent in the appt was 25 minutes and more than 50% was on counseling and direct patient cares.  All of the patient's questions were answered with apparent satisfaction. The patient knows to call the clinic with any problems, questions or concerns.    Sullivan Lone MD MS AAHIVMS [facility] Hematology/Oncology Physician [facility]  (Office):       [phone] (Work cell):  [phone] (Fax):           [phone]  08/04/2019 1:02 AM  I, Jacqualyn Posey, am acting as a Education administrator for Dr. Sullivan Lone.   .I have reviewed the above documentation for accuracy and completeness, and I agree with the above. Brunetta Genera MD

## 2019-08-07 ENCOUNTER — Telehealth: Payer: Self-pay | Admitting: Hematology

## 2019-08-07 NOTE — Telephone Encounter (Signed)
No los per 9/18.

## 2019-08-09 ENCOUNTER — Telehealth: Payer: Self-pay | Admitting: Hematology

## 2019-08-09 ENCOUNTER — Other Ambulatory Visit: Payer: Self-pay | Admitting: Hematology

## 2019-08-09 MED ORDER — PREDNISONE 20 MG PO TABS: ORAL_TABLET | ORAL | 0 refills | Status: AC

## 2019-08-09 NOTE — Telephone Encounter (Signed)
Verbal Order from Dr.Kale following referral of patient to Hospice/[facility]. Contacted @ [phone], spoke with [employer]. Demographic and contact information (for daughter) provided. They will contact patient.

## 2019-08-09 NOTE — Telephone Encounter (Signed)
I called the patient's daughter Ancil Boozer for a follow-up.  She discussed with the rest of her family and has decided to decline additional work-up with repeat lymph node biopsy and other CLL/SLL specific treatments at this time given the patient's very significantly advanced dementia. It is quite understandable that the patient does not understand his current condition, will not be able to report or handle treatment toxicities or be able to handle the burden of multiple cancer center visits for treatment and monitoring. Patient has decided to proceed with referral to hospice for the patient's advanced end-stage dementia as well as his progressive non-Hodgkin's leukemia lymphoma. She would like to try some steroids for the patient's comfort to try to reduce his neck swelling some.  We discussed that steroids could potentially help with reducing temporarily his neck swelling, boosting his appetite and energy levels.  It could also cause some emotional lability and potentially some anxiety which will need to be monitored for.  Patient's daughter and healthcare power of attorney understands that and is willing to proceed with steroids and a comfort care approach through hospice.  Sullivan Lone MD MS Hematology/Oncology Physician [facility]

## 2019-09-27 ENCOUNTER — Ambulatory Visit (INDEPENDENT_AMBULATORY_CARE_PROVIDER_SITE_OTHER): Payer: Medicare Other | Admitting: *Deleted

## 2019-09-27 DIAGNOSIS — I442 Atrioventricular block, complete: Secondary | ICD-10-CM

## 2019-09-27 DIAGNOSIS — I495 Sick sinus syndrome: Secondary | ICD-10-CM | POA: Diagnosis not present

## 2019-09-27 LAB — CUP PACEART REMOTE DEVICE CHECK
Battery Remaining Longevity: 51 mo
Battery Remaining Percentage: 46 %
Battery Voltage: 2.86 V
Brady Statistic AP VP Percent: 99 %
Brady Statistic AP VS Percent: 1 %
Brady Statistic AS VP Percent: 1 %
Brady Statistic AS VS Percent: 1 %
Brady Statistic RA Percent Paced: 99 %
Brady Statistic RV Percent Paced: 99 %
Date Time Interrogation Session: 20201110072033
Implantable Lead Implant Date: 19980305
Implantable Lead Implant Date: 19980305
Implantable Lead Location: 753859
Implantable Lead Location: 753860
Implantable Pulse Generator Implant Date: 20140218
Lead Channel Impedance Value: 450 Ohm
Lead Channel Impedance Value: 630 Ohm
Lead Channel Pacing Threshold Amplitude: 0.75 V
Lead Channel Pacing Threshold Amplitude: 1.125 V
Lead Channel Pacing Threshold Pulse Width: 0.4 ms
Lead Channel Pacing Threshold Pulse Width: 0.4 ms
Lead Channel Sensing Intrinsic Amplitude: 12 mV
Lead Channel Sensing Intrinsic Amplitude: 5 mV
Lead Channel Setting Pacing Amplitude: 1.375
Lead Channel Setting Pacing Amplitude: 2 V
Lead Channel Setting Pacing Pulse Width: 0.4 ms
Lead Channel Setting Sensing Sensitivity: 2 mV
Pulse Gen Model: 2210
Pulse Gen Serial Number: 7448063

## 2019-09-28 ENCOUNTER — Telehealth: Payer: Self-pay | Admitting: Cardiovascular Disease

## 2019-09-28 NOTE — Telephone Encounter (Signed)
Patient's daughter, Helene Kelp, state that her father is going into a nursing home. He has a Psychologist, forensic and the set up at his house is moving to the nursing home with him and she needs to make sure she sets it up correctly whenever he moves in. Please advise.

## 2019-09-28 NOTE — Telephone Encounter (Signed)
I answered the pt daughter questions. She states the pt will be moving in the [facility] on [address]. I also told her his next remote date is 2/10/20201. The pt daughter verbalized understanding and thanked me for calling her back.

## 2019-09-28 NOTE — Telephone Encounter (Signed)
We can set it up with the NH staff as a remote/virtual visit. It is important to take his pacemaker transmitter with him so that we continue to get his remote downloads every 3 months.

## 2019-09-28 NOTE — Telephone Encounter (Signed)
Patient's daughter, Helene Kelp, states that her father is going into a nursing home and she wonders if his appt with Dr. Sallyanne Kuster on 10/23/19 at 11:00am is necessary. Please advise.

## 2019-09-28 NOTE — Telephone Encounter (Signed)
Will route to MD to advise.   Thank you!   

## 2019-09-29 NOTE — Telephone Encounter (Signed)
Spoke with the daughter. She stated that the patient will now be in [facility]. He will have his transmitter with him and the nurse at [facility] will make sure that the downloads continue.  The patient has been diagnosed with Lymphoma and the family has declined any further work up/treatment at this time due to the patient's health.  She stated that [facility] may be able to do a virtual visit if needed but the patient will not understand at this point.

## 2019-09-29 NOTE — Telephone Encounter (Signed)
Not if the family does not see it as useful. We can cancel.

## 2019-10-09 NOTE — Telephone Encounter (Signed)
Called patient, advised of previous message from Dr.C- advised that her dad is doing well and they did not see need in visit right now, asked to cancel the upcoming visit. Cancelled that appointment, will route to Primary nurse just as FYI.

## 2019-10-09 NOTE — Telephone Encounter (Signed)
Follow up   Daughter would like to know if 12/7 should be changed to later date. Patient in SNF.

## 2019-10-18 NOTE — Progress Notes (Signed)
Remote pacemaker transmission.   

## 2019-10-23 ENCOUNTER — Encounter: Payer: Medicare Other | Admitting: Cardiovascular Disease

## 2019-10-24 ENCOUNTER — Telehealth: Payer: Self-pay

## 2019-10-24 NOTE — Telephone Encounter (Signed)
Pt daughter Helene Kelp wanted to know if the pt monitor is working. I asked her to have the [facility] to send a transmission with the pt home remote monitor. She asked me to call her if I see the transmission to let her know the monitor working. She also asked me to call the [facility] [phone] if I do not see the transmission by tomorrow. I told her I will call her tomorrow to let her know if we received the transmission. Helene Kelp verbalized understanding.

## 2019-10-25 NOTE — Telephone Encounter (Signed)
I called the [facility] and talked to the nurse Helene Kelp. She tried to send the transmission but the pt monitor was not plugged into a phone line. I told her I would order a cell adapter for the pt. I told her as soon as she received the cell adapter to call me and I will help her send a transmission with the pt monitor.    I called the pt daughter Helene Kelp to update her on what I told the [facility]. I told her I was not going to closed the pt encounter until I get the transmission from his monitor to make sure it is working properly. The pt daughter thanked me for the call and thanked me for my help.

## 2019-10-27 NOTE — Telephone Encounter (Signed)
Cell adapter mailed. Delivery date 10-30-2019

## 2019-11-02 NOTE — Telephone Encounter (Signed)
I spoke with the pt nurse at the [facility] and they did have the pt cell adapter. She hooked it up while I was on the phone. The monitor started at the tower and reached the stars. The last communication date shows todays date. The monitor started at the tower again. I told the nurse that the monitor is updating. I told her the pt does not need to sit near the monitor because I can see it is working.   I called the pt daughter and informed her that the nursing home did received the cell adapter and the nurse did try to send the transmission. She thanked me for the call and for my help.

## 2019-11-02 NOTE — Telephone Encounter (Signed)
I left a message for the pt daughter to let her know I did reach out to the [facility] to see if his cell adapter was received. I did not get to talk with the pt nurse but left a message for her.

## 2019-12-12 ENCOUNTER — Telehealth: Payer: Self-pay | Admitting: Cardiovascular Disease

## 2019-12-12 NOTE — Telephone Encounter (Signed)
Called to schedule yearly follow up with Dr. Pamala Hurry Ancil Boozer (daughter) pt is now in a nursing home and will not be coming to the office unless absolutely necessary.

## 2019-12-12 NOTE — Telephone Encounter (Signed)
Spoke with daughter to schedule yearly appointment with Dr. Margretta Ditty states patient is now in a nursing home.  She states the patient's "device remote transmission " box was placed in his room at the nursing home and she is wondering if it is working properly.

## 2019-12-12 NOTE — Telephone Encounter (Signed)
The daughter has been made aware that the last download was received in November. The patient moved in October so the downloads have been received successfully since the move.   Next download is scheduled for 12/27/19.

## 2019-12-27 ENCOUNTER — Ambulatory Visit (INDEPENDENT_AMBULATORY_CARE_PROVIDER_SITE_OTHER): Admitting: *Deleted

## 2019-12-27 DIAGNOSIS — I495 Sick sinus syndrome: Secondary | ICD-10-CM | POA: Diagnosis not present

## 2019-12-29 LAB — CUP PACEART REMOTE DEVICE CHECK
Battery Remaining Longevity: 44 mo
Battery Remaining Percentage: 40 %
Battery Voltage: 2.84 V
Brady Statistic AP VP Percent: 99 %
Brady Statistic AP VS Percent: 1 %
Brady Statistic AS VP Percent: 1 %
Brady Statistic AS VS Percent: 1 %
Brady Statistic RA Percent Paced: 99 %
Brady Statistic RV Percent Paced: 99 %
Date Time Interrogation Session: 20210209045556
Implantable Lead Implant Date: 19980305
Implantable Lead Implant Date: 19980305
Implantable Lead Location: 753859
Implantable Lead Location: 753860
Implantable Pulse Generator Implant Date: 20140218
Lead Channel Impedance Value: 440 Ohm
Lead Channel Impedance Value: 630 Ohm
Lead Channel Pacing Threshold Amplitude: 0.75 V
Lead Channel Pacing Threshold Amplitude: 1 V
Lead Channel Pacing Threshold Pulse Width: 0.4 ms
Lead Channel Pacing Threshold Pulse Width: 0.4 ms
Lead Channel Sensing Intrinsic Amplitude: 12 mV
Lead Channel Sensing Intrinsic Amplitude: 3.6 mV
Lead Channel Setting Pacing Amplitude: 1.25 V
Lead Channel Setting Pacing Amplitude: 2 V
Lead Channel Setting Pacing Pulse Width: 0.4 ms
Lead Channel Setting Sensing Sensitivity: 2 mV
Pulse Gen Model: 2210
Pulse Gen Serial Number: 7448063

## 2020-03-27 ENCOUNTER — Ambulatory Visit (INDEPENDENT_AMBULATORY_CARE_PROVIDER_SITE_OTHER): Payer: Medicare Other | Admitting: *Deleted

## 2020-03-27 DIAGNOSIS — I442 Atrioventricular block, complete: Secondary | ICD-10-CM | POA: Diagnosis not present

## 2020-03-27 LAB — CUP PACEART REMOTE DEVICE CHECK
Battery Remaining Longevity: 38 mo
Battery Remaining Percentage: 35 %
Battery Voltage: 2.83 V
Brady Statistic AP VP Percent: 99 %
Brady Statistic AP VS Percent: 1 %
Brady Statistic AS VP Percent: 1 %
Brady Statistic AS VS Percent: 1 %
Brady Statistic RA Percent Paced: 99 %
Brady Statistic RV Percent Paced: 99 %
Date Time Interrogation Session: 20210511022701
Implantable Lead Implant Date: 19980305
Implantable Lead Implant Date: 19980305
Implantable Lead Location: 753859
Implantable Lead Location: 753860
Implantable Pulse Generator Implant Date: 20140218
Lead Channel Impedance Value: 440 Ohm
Lead Channel Impedance Value: 540 Ohm
Lead Channel Pacing Threshold Amplitude: 0.75 V
Lead Channel Pacing Threshold Amplitude: 1.25 V
Lead Channel Pacing Threshold Pulse Width: 0.4 ms
Lead Channel Pacing Threshold Pulse Width: 0.4 ms
Lead Channel Sensing Intrinsic Amplitude: 10.2 mV
Lead Channel Sensing Intrinsic Amplitude: 3.9 mV
Lead Channel Setting Pacing Amplitude: 1.5 V
Lead Channel Setting Pacing Amplitude: 2 V
Lead Channel Setting Pacing Pulse Width: 0.4 ms
Lead Channel Setting Sensing Sensitivity: 2 mV
Pulse Gen Model: 2210
Pulse Gen Serial Number: 7448063

## 2020-03-28 NOTE — Progress Notes (Signed)
Remote pacemaker transmission.   

## 2020-06-26 ENCOUNTER — Ambulatory Visit (INDEPENDENT_AMBULATORY_CARE_PROVIDER_SITE_OTHER): Admitting: *Deleted

## 2020-06-26 DIAGNOSIS — I442 Atrioventricular block, complete: Secondary | ICD-10-CM | POA: Diagnosis not present

## 2020-06-26 LAB — CUP PACEART REMOTE DEVICE CHECK
Battery Remaining Longevity: 33 mo
Battery Remaining Percentage: 29 %
Battery Voltage: 2.81 V
Brady Statistic AP VP Percent: 99 %
Brady Statistic AP VS Percent: 1 %
Brady Statistic AS VP Percent: 1 %
Brady Statistic AS VS Percent: 1 %
Brady Statistic RA Percent Paced: 99 %
Brady Statistic RV Percent Paced: 99 %
Date Time Interrogation Session: 20210810025758
Implantable Lead Implant Date: 19980305
Implantable Lead Implant Date: 19980305
Implantable Lead Location: 753859
Implantable Lead Location: 753860
Implantable Pulse Generator Implant Date: 20140218
Lead Channel Impedance Value: 430 Ohm
Lead Channel Impedance Value: 630 Ohm
Lead Channel Pacing Threshold Amplitude: 0.75 V
Lead Channel Pacing Threshold Amplitude: 1.25 V
Lead Channel Pacing Threshold Pulse Width: 0.4 ms
Lead Channel Pacing Threshold Pulse Width: 0.4 ms
Lead Channel Sensing Intrinsic Amplitude: 11.3 mV
Lead Channel Sensing Intrinsic Amplitude: 5 mV
Lead Channel Setting Pacing Amplitude: 1.5 V
Lead Channel Setting Pacing Amplitude: 2 V
Lead Channel Setting Pacing Pulse Width: 0.4 ms
Lead Channel Setting Sensing Sensitivity: 2 mV
Pulse Gen Model: 2210
Pulse Gen Serial Number: 7448063

## 2020-06-28 NOTE — Progress Notes (Signed)
Remote pacemaker transmission.   

## 2020-09-25 ENCOUNTER — Ambulatory Visit (INDEPENDENT_AMBULATORY_CARE_PROVIDER_SITE_OTHER)

## 2020-09-25 DIAGNOSIS — I442 Atrioventricular block, complete: Secondary | ICD-10-CM | POA: Diagnosis not present

## 2020-09-25 LAB — CUP PACEART REMOTE DEVICE CHECK
Battery Remaining Longevity: 20 mo
Battery Remaining Percentage: 19 %
Battery Voltage: 2.77 V
Brady Statistic AP VP Percent: 99 %
Brady Statistic AP VS Percent: 1 %
Brady Statistic AS VP Percent: 1 %
Brady Statistic AS VS Percent: 1 %
Brady Statistic RA Percent Paced: 99 %
Brady Statistic RV Percent Paced: 99 %
Date Time Interrogation Session: 20211109022919
Implantable Lead Implant Date: 19980305
Implantable Lead Implant Date: 19980305
Implantable Lead Location: 753859
Implantable Lead Location: 753860
Implantable Pulse Generator Implant Date: 20140218
Lead Channel Impedance Value: 430 Ohm
Lead Channel Impedance Value: 590 Ohm
Lead Channel Pacing Threshold Amplitude: 0.75 V
Lead Channel Pacing Threshold Amplitude: 1 V
Lead Channel Pacing Threshold Pulse Width: 0.4 ms
Lead Channel Pacing Threshold Pulse Width: 0.4 ms
Lead Channel Sensing Intrinsic Amplitude: 3.5 mV
Lead Channel Sensing Intrinsic Amplitude: 7.8 mV
Lead Channel Setting Pacing Amplitude: 1.25 V
Lead Channel Setting Pacing Amplitude: 2 V
Lead Channel Setting Pacing Pulse Width: 0.4 ms
Lead Channel Setting Sensing Sensitivity: 2 mV
Pulse Gen Model: 2210
Pulse Gen Serial Number: 7448063

## 2020-09-26 NOTE — Progress Notes (Signed)
Remote pacemaker transmission.   

## 2020-11-17 ENCOUNTER — Emergency Department (HOSPITAL_COMMUNITY)
Admission: EM | Admit: 2020-11-17 | Discharge: 2020-11-17 | Disposition: A | Discharge status: Home / Self Care | Attending: Emergency Medicine | Admitting: Emergency Medicine

## 2020-11-17 ENCOUNTER — Emergency Department (HOSPITAL_COMMUNITY)

## 2020-11-17 ENCOUNTER — Other Ambulatory Visit: Payer: Self-pay

## 2020-11-17 DIAGNOSIS — S161XXA Strain of muscle, fascia and tendon at neck level, initial encounter: Secondary | ICD-10-CM | POA: Diagnosis not present

## 2020-11-17 DIAGNOSIS — Z7401 Bed confinement status: Secondary | ICD-10-CM | POA: Diagnosis not present

## 2020-11-17 DIAGNOSIS — I442 Atrioventricular block, complete: Secondary | ICD-10-CM | POA: Insufficient documentation

## 2020-11-17 DIAGNOSIS — Z79899 Other long term (current) drug therapy: Secondary | ICD-10-CM | POA: Diagnosis not present

## 2020-11-17 DIAGNOSIS — M546 Pain in thoracic spine: Secondary | ICD-10-CM | POA: Diagnosis not present

## 2020-11-17 DIAGNOSIS — S299XXA Unspecified injury of thorax, initial encounter: Secondary | ICD-10-CM | POA: Diagnosis present

## 2020-11-17 DIAGNOSIS — F0281 Dementia in other diseases classified elsewhere with behavioral disturbance: Secondary | ICD-10-CM | POA: Insufficient documentation

## 2020-11-17 DIAGNOSIS — J8489 Other specified interstitial pulmonary diseases: Secondary | ICD-10-CM | POA: Diagnosis not present

## 2020-11-17 DIAGNOSIS — W19XXXA Unspecified fall, initial encounter: Secondary | ICD-10-CM | POA: Insufficient documentation

## 2020-11-17 DIAGNOSIS — Z87891 Personal history of nicotine dependence: Secondary | ICD-10-CM | POA: Insufficient documentation

## 2020-11-17 DIAGNOSIS — I213 ST elevation (STEMI) myocardial infarction of unspecified site: Secondary | ICD-10-CM | POA: Diagnosis not present

## 2020-11-17 DIAGNOSIS — Z743 Need for continuous supervision: Secondary | ICD-10-CM | POA: Diagnosis not present

## 2020-11-17 DIAGNOSIS — Z950 Presence of cardiac pacemaker: Secondary | ICD-10-CM | POA: Diagnosis not present

## 2020-11-17 DIAGNOSIS — S2232XA Fracture of one rib, left side, initial encounter for closed fracture: Secondary | ICD-10-CM | POA: Insufficient documentation

## 2020-11-17 DIAGNOSIS — J40 Bronchitis, not specified as acute or chronic: Secondary | ICD-10-CM | POA: Diagnosis not present

## 2020-11-17 DIAGNOSIS — S0990XA Unspecified injury of head, initial encounter: Secondary | ICD-10-CM | POA: Diagnosis not present

## 2020-11-17 DIAGNOSIS — M5489 Other dorsalgia: Secondary | ICD-10-CM | POA: Diagnosis not present

## 2020-11-17 DIAGNOSIS — M549 Dorsalgia, unspecified: Secondary | ICD-10-CM | POA: Diagnosis not present

## 2020-11-17 DIAGNOSIS — I739 Peripheral vascular disease, unspecified: Secondary | ICD-10-CM | POA: Diagnosis not present

## 2020-11-17 DIAGNOSIS — R404 Transient alteration of awareness: Secondary | ICD-10-CM | POA: Diagnosis not present

## 2020-11-17 DIAGNOSIS — M542 Cervicalgia: Secondary | ICD-10-CM | POA: Insufficient documentation

## 2020-11-17 DIAGNOSIS — J209 Acute bronchitis, unspecified: Secondary | ICD-10-CM

## 2020-11-17 DIAGNOSIS — Y92121 Bathroom in nursing home as the place of occurrence of the external cause: Secondary | ICD-10-CM | POA: Insufficient documentation

## 2020-11-17 DIAGNOSIS — C8590 Non-Hodgkin lymphoma, unspecified, unspecified site: Secondary | ICD-10-CM | POA: Diagnosis not present

## 2020-11-17 DIAGNOSIS — R6889 Other general symptoms and signs: Secondary | ICD-10-CM | POA: Diagnosis not present

## 2020-11-17 DIAGNOSIS — S199XXA Unspecified injury of neck, initial encounter: Secondary | ICD-10-CM | POA: Diagnosis not present

## 2020-11-17 DIAGNOSIS — Z8659 Personal history of other mental and behavioral disorders: Secondary | ICD-10-CM

## 2020-11-17 DIAGNOSIS — M2550 Pain in unspecified joint: Secondary | ICD-10-CM | POA: Diagnosis not present

## 2020-11-17 DIAGNOSIS — G309 Alzheimer's disease, unspecified: Secondary | ICD-10-CM | POA: Diagnosis not present

## 2020-11-17 DIAGNOSIS — I10 Essential (primary) hypertension: Secondary | ICD-10-CM | POA: Diagnosis not present

## 2020-11-17 DIAGNOSIS — I6529 Occlusion and stenosis of unspecified carotid artery: Secondary | ICD-10-CM | POA: Diagnosis not present

## 2020-11-17 DIAGNOSIS — R52 Pain, unspecified: Secondary | ICD-10-CM | POA: Diagnosis not present

## 2020-11-17 LAB — CBC WITH DIFFERENTIAL/PLATELET
Abs Immature Granulocytes: 0.1 10*3/uL — ABNORMAL HIGH (ref 0.00–0.07)
Basophils Absolute: 0 10*3/uL (ref 0.0–0.1)
Basophils Relative: 0 %
Eosinophils Absolute: 0 10*3/uL (ref 0.0–0.5)
Eosinophils Relative: 0 %
HCT: 36.6 % — ABNORMAL LOW (ref 39.0–52.0)
Hemoglobin: 11.8 g/dL — ABNORMAL LOW (ref 13.0–17.0)
Immature Granulocytes: 1 %
Lymphocytes Relative: 35 %
Lymphs Abs: 4.3 10*3/uL — ABNORMAL HIGH (ref 0.7–4.0)
MCH: 31.6 pg (ref 26.0–34.0)
MCHC: 32.2 g/dL (ref 30.0–36.0)
MCV: 98.1 fL (ref 80.0–100.0)
Monocytes Absolute: 0.7 10*3/uL (ref 0.1–1.0)
Monocytes Relative: 5 %
Neutro Abs: 7.4 10*3/uL (ref 1.7–7.7)
Neutrophils Relative %: 59 %
Platelets: 274 10*3/uL (ref 150–400)
RBC: 3.73 MIL/uL — ABNORMAL LOW (ref 4.22–5.81)
RDW: 14.1 % (ref 11.5–15.5)
WBC: 12.6 10*3/uL — ABNORMAL HIGH (ref 4.0–10.5)
nRBC: 0 % (ref 0.0–0.2)

## 2020-11-17 LAB — BASIC METABOLIC PANEL
Anion gap: 11 (ref 5–15)
BUN: 30 mg/dL — ABNORMAL HIGH (ref 8–23)
CO2: 23 mmol/L (ref 22–32)
Calcium: 9.2 mg/dL (ref 8.9–10.3)
Chloride: 104 mmol/L (ref 98–111)
Creatinine, Ser: 1.4 mg/dL — ABNORMAL HIGH (ref 0.61–1.24)
GFR, Estimated: 49 mL/min — ABNORMAL LOW (ref >60)
Glucose, Bld: 115 mg/dL — ABNORMAL HIGH (ref 70–99)
Potassium: 3.8 mmol/L (ref 3.5–5.1)
Sodium: 138 mmol/L (ref 135–145)

## 2020-11-17 LAB — CK: Total CK: 97 U/L (ref 49–397)

## 2020-11-17 MED ORDER — DOXYCYCLINE HYCLATE 100 MG PO CAPS: 100.0000 mg | ORAL_CAPSULE | Freq: Two times a day (BID) | ORAL | 0 refills | Status: DC

## 2020-11-17 MED ORDER — DOXYCYCLINE HYCLATE 100 MG PO TABS
100.0000 mg | ORAL_TABLET | Freq: Once | ORAL | Status: AC
  Administered 2020-11-17: 100 mg via ORAL
  Filled 2020-11-17: qty 1

## 2020-11-17 NOTE — ED Notes (Signed)
Paperwork for PTAR has been prepared.

## 2020-11-17 NOTE — ED Triage Notes (Signed)
Pt arrived via ems due to a fall at [facility]. There is no definitive time of the fall, the facility staff found the patient on the bathroom floor. Pt was fond lying on his right side. Ems reports pt was complaining of neck pain as well as left sided pain. Pt is not on thinners.Vitals within normal limits; bp 144/52,p 68. Temp 97.8, respirations 17. Ems denies any deformities, or loc. Pt does have a previous skin tear on his left elbow

## 2020-11-17 NOTE — ED Notes (Signed)
Pt waiting on PTAR 

## 2020-11-17 NOTE — Discharge Instructions (Addendum)
Begin taking doxycycline as prescribed.  Continue other medications as previously prescribed.  Return to the emergency department if you develop severe headache, difficulty breathing, or other new and concerning symptoms.

## 2020-11-17 NOTE — ED Notes (Signed)
Pt was seen with his legs intertwined in the stretchers bed rails, pt was repositioned for comfort, he was given a pillow & a BP cuff to fidget with in his hands, tech updated his V/S, pt shows no discomfort at this time, just baseline confusion.

## 2020-11-17 NOTE — ED Notes (Signed)
Changed pts old wound dressing on right elbow with new dressing.

## 2020-11-17 NOTE — ED Provider Notes (Addendum)
[facility] EMERGENCY DEPARTMENT Provider Note   CSN: [id number] Arrival date & time: 11/17/20  0201     History Chief Complaint  Patient presents with  . Fall    Kenneth Clay is a 87 y.o. male.  Patient is an 87 year old male with past medical history of dementia, complete heart block with pacemaker, hypertension, hyperlipidemia.  Patient sent from his extended care facility for evaluation of fall.  According to the staff at [facility], patient was found on the bathroom floor lying on his right side.  Patient complaining of neck pain and left sided pain to the assisted-living staff, but denies to me anything is hurting.  Patient has no recollection of falling and does not understand why he is here.  He denies any complaints at present.  The history is provided by the patient.  Fall This is a new problem. The current episode started less than 1 hour ago. The problem occurs constantly. The problem has not changed since onset.Nothing aggravates the symptoms. Nothing relieves the symptoms. He has tried nothing for the symptoms.       Past Medical History:  Diagnosis Date  . CHB (complete heart block) (HCC) 09/03/2015  . Dementia (HCC)   . Depression   . H/O cardiovascular stress test 04/30/2010   Persantine myoview-normal  . H/O: GI bleed   . Hyperlipidemia   . Hypertension   . LV dysfunction    last echo-04/30/10, EF 50-55%, impaired LV relaxation  . Pacemaker    new gen. last check with insert 01/03/13  . Sinus node dysfunction (HCC)   . Ulcer     Patient Active Problem List   Diagnosis Date Noted  . Hypercholesterolemia 10/20/2018  . CAP (community acquired pneumonia) 01/12/2016  . NSTEMI (non-ST elevated myocardial infarction) (HCC) 01/12/2016  . DOE (dyspnea on exertion) 01/12/2016  . Elevated troponin   . Sepsis (HCC)   . Sinus node dysfunction (HCC) 09/03/2015  . Pacemaker-dependent due to native cardiac rhythm insufficient to support life  09/03/2015  . CHB (complete heart block) (HCC) 09/03/2015  . HTN (hypertension) 01/03/2013  . Dyslipidemia 01/03/2013  . Abnormal involuntary movements(781.0) 12/27/2012  . Alzheimer's disease (HCC) 12/27/2012  . Unspecified visual loss 12/27/2012  . Other postprocedural status(V45.89) 12/27/2012  . Pacemaker for sinus node dysfunction-'98, '05- Gen change 01/03/13 (St Jude)   . Dementia (HCC)   . Prior PUD     Past Surgical History:  Procedure Laterality Date  . PACEMAKER GENERATOR CHANGE  2005   St. Jude Idenity ADxXL DR  . PACEMAKER GENERATOR CHANGE  01/03/2013   ST. Jude Accent DR RF  . PACEMAKER GENERATOR CHANGE N/A 01/03/2013   Procedure: PACEMAKER GENERATOR CHANGE;  Surgeon: Thurmon Fair, MD;  Location: [facility] CATH LAB;  Service: Cardiovascular;  Laterality: N/A;  . PACEMAKER INSERTION  1998  . ROTATOR CUFF REPAIR     bilateral  . sciatica     injection 06-2015  . US ECHOCARDIOGRAPHY  04/30/10   aortic root sclerotic,RV mildly dilated       Family History  Problem Relation Age of Onset  . Diabetes Mother   . Heart disease Father   . Diabetes Brother     Social History   Tobacco Use  . Smoking status: Former Smoker    Quit date: 11/16/2004    Years since quitting: 16.0  . Smokeless tobacco: Never Used  Substance Use Topics  . Alcohol use: No  . Drug use: No  Home Medications Prior to Admission medications   Medication Sig Start Date End Date Taking? Authorizing Provider  amLODipine (NORVASC) 2.5 MG tablet Take 2.5 mg by mouth daily.    [provider]  Ascorbic Acid (VITAMIN C) 1000 MG tablet Take 1,000 mg by mouth daily.    [provider]  atorvastatin (LIPITOR) 10 MG tablet Take 1 tablet (10 mg total) by mouth daily at 6 PM. 01/13/16   Alison Murray, MD  benazepril (LOTENSIN) 40 MG tablet Take 1 tablet (40 mg total) by mouth daily. 09/22/16   Croitoru, Mihai, MD  busPIRone (BUSPAR) 5 MG tablet Take 5 mg by mouth 2 (two) times daily.  05/24/17    [provider]  citalopram (CELEXA) 10 MG tablet Take 10 mg by mouth daily.    [provider]  donepezil (ARICEPT) 10 MG tablet Take 1 tablet (10 mg total) by mouth at bedtime. 03/25/16   York Spaniel, MD  LORazepam (ATIVAN) 0.5 MG tablet TAKE 1 2 (ONE HALF) TABLET BY MOUTH ONCE DAILY AS NEEDED UPON AGITATION 06/20/19   [provider]  memantine (NAMENDA) 10 MG tablet Take 1 tablet (10 mg total) by mouth 2 (two) times daily. 06/19/16   York Spaniel, MD  pantoprazole (PROTONIX) 40 MG tablet Take 40 mg by mouth daily.     [provider]    Allergies    Patient has no known allergies.  Review of Systems   Review of Systems  Unable to perform ROS: Dementia    Physical Exam Updated Vital Signs BP (!) 137/55   Pulse 86   Temp 98 F (36.7 C) (Oral)   Resp (!) 21   Ht 5\' 5"  (1.651 m)   Wt 79.3 kg   SpO2 97%   BMI 29.09 kg/m   Physical Exam Vitals and nursing note reviewed.  Constitutional:      General: He is not in acute distress.    Appearance: He is well-developed and well-nourished. He is not diaphoretic.  HENT:     Head: Normocephalic and atraumatic.     Mouth/Throat:     Mouth: Oropharynx is clear and moist.  Cardiovascular:     Rate and Rhythm: Normal rate and regular rhythm.     Heart sounds: No murmur heard. No friction rub.  Pulmonary:     Effort: Pulmonary effort is normal. No respiratory distress.     Breath sounds: Normal breath sounds. No wheezing or rales.     Comments: There is no tenderness of the chest wall.  There is no crepitus palpable. Abdominal:     General: Bowel sounds are normal. There is no distension.     Palpations: Abdomen is soft.     Tenderness: There is no abdominal tenderness.  Musculoskeletal:        General: No edema. Normal range of motion.     Cervical back: Normal range of motion and neck supple.     Comments: There is no tenderness of the thoracic spine.  There is no bony tenderness or  step-off.  Pelvis is stable.  He has good range of motion of both hips with no discomfort.  Skin:    General: Skin is warm and dry.  Neurological:     General: No focal deficit present.     Mental Status: He is alert and oriented to person, place, and time.     Cranial Nerves: No cranial nerve deficit.     Coordination: Coordination normal.  ED Results / Procedures / Treatments   Labs (all labs ordered are listed, but only abnormal results are displayed) Labs Reviewed - No data to display  EKG None  Radiology No results found.  Procedures Procedures (including critical care time)  Medications Ordered in ED Medications - No data to display  ED Course  I have reviewed the triage vital signs and the nursing notes.  Pertinent labs & imaging results that were available during my care of the patient were reviewed by me and considered in my medical decision making (see chart for details).    MDM Rules/Calculators/A&P  Patient is an 87 year old male brought for evaluation of fall.  Patient was at his his assisted living facility and was found on the floor.  Patient with history of dementia and has no recollection of what happened.  He denies to me that he is experiencing any significant discomfort, but there was the mention of possible pain in his left chest.    His x-ray does show what appears to be an 8th rib fracture which is nondisplaced.  CT scans of the head and cervical spine are negative.  Laboratory studies obtained show no evidence for acute abnormality including CBC, BMP, and CK.  At this point, patient appears to be back to his baseline.  Patient's daughter is present at bedside and is comfortable with him returning home.  Daughter also concerned about cough for the past several days.  Patient has had negative Covid test at the facility.  His rib films today reveal mildly increased interstitial markings at both lung bases, however but no focal infiltrate.  Patient  will be prescribed doxycycline for possible bronchitis.  Final Clinical Impression(s) / ED Diagnoses Final diagnoses:  None    Rx / DC Orders ED Discharge Orders    None       Geoffery Lyons, MD 11/17/20 7741    Geoffery Lyons, MD 11/17/20 [phone]

## 2020-12-06 DIAGNOSIS — W19XXXA Unspecified fall, initial encounter: Secondary | ICD-10-CM | POA: Diagnosis not present

## 2020-12-06 DIAGNOSIS — M79631 Pain in right forearm: Secondary | ICD-10-CM | POA: Diagnosis not present

## 2020-12-06 DIAGNOSIS — M25511 Pain in right shoulder: Secondary | ICD-10-CM | POA: Diagnosis not present

## 2020-12-15 ENCOUNTER — Other Ambulatory Visit: Payer: Self-pay

## 2020-12-15 ENCOUNTER — Emergency Department (HOSPITAL_COMMUNITY)
Admission: EM | Admit: 2020-12-15 | Discharge: 2020-12-15 | Disposition: A | Discharge status: Home / Self Care | Attending: Emergency Medicine | Admitting: Emergency Medicine

## 2020-12-15 ENCOUNTER — Encounter (HOSPITAL_COMMUNITY): Payer: Self-pay | Admitting: Emergency Medicine

## 2020-12-15 DIAGNOSIS — W19XXXA Unspecified fall, initial encounter: Secondary | ICD-10-CM

## 2020-12-15 DIAGNOSIS — G309 Alzheimer's disease, unspecified: Secondary | ICD-10-CM | POA: Insufficient documentation

## 2020-12-15 DIAGNOSIS — U071 COVID-19: Secondary | ICD-10-CM | POA: Insufficient documentation

## 2020-12-15 DIAGNOSIS — R4182 Altered mental status, unspecified: Secondary | ICD-10-CM | POA: Diagnosis not present

## 2020-12-15 DIAGNOSIS — Z87891 Personal history of nicotine dependence: Secondary | ICD-10-CM | POA: Diagnosis not present

## 2020-12-15 DIAGNOSIS — I10 Essential (primary) hypertension: Secondary | ICD-10-CM | POA: Insufficient documentation

## 2020-12-15 DIAGNOSIS — Z79899 Other long term (current) drug therapy: Secondary | ICD-10-CM | POA: Insufficient documentation

## 2020-12-15 DIAGNOSIS — R0689 Other abnormalities of breathing: Secondary | ICD-10-CM | POA: Diagnosis not present

## 2020-12-15 DIAGNOSIS — Z7952 Long term (current) use of systemic steroids: Secondary | ICD-10-CM | POA: Insufficient documentation

## 2020-12-15 DIAGNOSIS — F0280 Dementia in other diseases classified elsewhere without behavioral disturbance: Secondary | ICD-10-CM | POA: Insufficient documentation

## 2020-12-15 DIAGNOSIS — Z950 Presence of cardiac pacemaker: Secondary | ICD-10-CM | POA: Diagnosis not present

## 2020-12-15 DIAGNOSIS — S40812A Abrasion of left upper arm, initial encounter: Secondary | ICD-10-CM | POA: Insufficient documentation

## 2020-12-15 DIAGNOSIS — S50812A Abrasion of left forearm, initial encounter: Secondary | ICD-10-CM | POA: Diagnosis not present

## 2020-12-15 DIAGNOSIS — M2550 Pain in unspecified joint: Secondary | ICD-10-CM | POA: Diagnosis not present

## 2020-12-15 DIAGNOSIS — W1830XA Fall on same level, unspecified, initial encounter: Secondary | ICD-10-CM | POA: Insufficient documentation

## 2020-12-15 DIAGNOSIS — Z743 Need for continuous supervision: Secondary | ICD-10-CM | POA: Diagnosis not present

## 2020-12-15 DIAGNOSIS — R5381 Other malaise: Secondary | ICD-10-CM | POA: Diagnosis not present

## 2020-12-15 DIAGNOSIS — S4992XA Unspecified injury of left shoulder and upper arm, initial encounter: Secondary | ICD-10-CM | POA: Diagnosis present

## 2020-12-15 DIAGNOSIS — Z7401 Bed confinement status: Secondary | ICD-10-CM | POA: Diagnosis not present

## 2020-12-15 LAB — CBC
HCT: 39.4 % (ref 39.0–52.0)
Hemoglobin: 12.4 g/dL — ABNORMAL LOW (ref 13.0–17.0)
MCH: 32.4 pg (ref 26.0–34.0)
MCHC: 31.5 g/dL (ref 30.0–36.0)
MCV: 102.9 fL — ABNORMAL HIGH (ref 80.0–100.0)
Platelets: 260 10*3/uL (ref 150–400)
RBC: 3.83 MIL/uL — ABNORMAL LOW (ref 4.22–5.81)
RDW: 15.2 % (ref 11.5–15.5)
WBC: 7 10*3/uL (ref 4.0–10.5)
nRBC: 0 % (ref 0.0–0.2)

## 2020-12-15 LAB — URINALYSIS, ROUTINE W REFLEX MICROSCOPIC
Bilirubin Urine: NEGATIVE
Glucose, UA: NEGATIVE mg/dL
Hgb urine dipstick: NEGATIVE
Ketones, ur: NEGATIVE mg/dL
Leukocytes,Ua: NEGATIVE
Nitrite: NEGATIVE
Protein, ur: NEGATIVE mg/dL
Specific Gravity, Urine: 1.014 (ref 1.005–1.030)
pH: 7 (ref 5.0–8.0)

## 2020-12-15 LAB — COMPREHENSIVE METABOLIC PANEL
ALT: 7 U/L (ref 0–44)
AST: 16 U/L (ref 15–41)
Albumin: 3.1 g/dL — ABNORMAL LOW (ref 3.5–5.0)
Alkaline Phosphatase: 91 U/L (ref 38–126)
Anion gap: 10 (ref 5–15)
BUN: 15 mg/dL (ref 8–23)
CO2: 24 mmol/L (ref 22–32)
Calcium: 8.9 mg/dL (ref 8.9–10.3)
Chloride: 104 mmol/L (ref 98–111)
Creatinine, Ser: 0.91 mg/dL (ref 0.61–1.24)
GFR, Estimated: >60 mL/min (ref >60)
Glucose, Bld: 127 mg/dL — ABNORMAL HIGH (ref 70–99)
Potassium: 4.1 mmol/L (ref 3.5–5.1)
Sodium: 138 mmol/L (ref 135–145)
Total Bilirubin: 0.7 mg/dL (ref 0.3–1.2)
Total Protein: 6.1 g/dL — ABNORMAL LOW (ref 6.5–8.1)

## 2020-12-15 NOTE — Discharge Instructions (Addendum)
Left arm injury from the fall, requires ongoing treatment with cleansing using soap and water and applying a nonstick gauze bandage afterwards.  To this cleansing twice a day, until the wound heals.  For pain use Tylenol.  For Covid, treat as usual with supportive care.  At this time there is no sign of complications from the Covid infection or significant further injuries from the fall today.

## 2020-12-15 NOTE — ED Provider Notes (Signed)
[facility] EMERGENCY DEPARTMENT Provider Note   CSN: [id number] Arrival date & time: 12/15/20  1253     History Chief Complaint  Patient presents with  . AMS    Kenneth Clay. is a 87 y.o. male.  HPI Patient here from his facility where he is receiving end-of-life care, including hospice services, and presents for altered mental status.  Also he was found on the floor earlier today.  His daughter is with him and states that she is concerned about swelling in his lower legs.  She reports that he was started on Lasix, yesterday.  She also states he was diagnosed with Covid, 3 days ago.   Level 5 caveat-dementia    Past Medical History:  Diagnosis Date  . CHB (complete heart block) ([place]) 09/03/2015  . Dementia ([place])   . Depression   . H/O cardiovascular stress test 04/30/2010   Persantine myoview-normal  . H/O: GI bleed   . Hyperlipidemia   . Hypertension   . LV dysfunction    last echo-04/30/10, EF 50-55%, impaired LV relaxation  . Pacemaker    new gen. last check with insert 01/03/13  . Sinus node dysfunction (HCC)   . Ulcer     Patient Active Problem List   Diagnosis Date Noted  . Hypercholesterolemia 10/20/2018  . CAP (community acquired pneumonia) 01/12/2016  . NSTEMI (non-ST elevated myocardial infarction) ([place]) 01/12/2016  . DOE (dyspnea on exertion) 01/12/2016  . Elevated troponin   . Sepsis ([place])   . Sinus node dysfunction ([place]) 09/03/2015  . 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 CHB (complete heart block) ([place]) 09/03/2015  . HTN (hypertension) 01/03/2013  . Dyslipidemia 01/03/2013  . Abnormal involuntary movements(781.0) 12/27/2012  . Alzheimer's disease ([place]) 12/27/2012  . Unspecified visual loss 12/27/2012  . Other postprocedural status(V45.89) 12/27/2012  . Pacemaker for sinus node dysfunction-'98, '05- Gen change 01/03/13 (St Jude)   . Dementia ([place])   . Prior PUD      Past Surgical History:  Procedure Laterality Date  . PACEMAKER GENERATOR CHANGE  2005   St. Jude Idenity ADxXL DR  . PACEMAKER GENERATOR CHANGE  01/03/2013   ST. Jude Accent DR RF  . PACEMAKER GENERATOR CHANGE N/A 01/03/2013   Procedure: PACEMAKER GENERATOR CHANGE;  Surgeon: Sanda Klein, MD;  Location: [facility] CATH LAB;  Service: Cardiovascular;  Laterality: N/A;  . PACEMAKER INSERTION  1998  . ROTATOR CUFF REPAIR     bilateral  . sciatica     injection 06-2015  . US ECHOCARDIOGRAPHY  04/30/10   aortic root sclerotic,RV mildly dilated       Family History  Problem Relation Age of Onset  . Diabetes Mother   . Heart disease Father   . Diabetes Brother     Social History   Tobacco Use  . Smoking status: Former Smoker    Quit date: 11/16/2004    Years since quitting: 16.0  . Smokeless tobacco: Never Used  Substance Use Topics  . Alcohol use: No  . Drug use: No    Home Medications Prior to Admission medications   Medication Sig Start Date End Date Taking? Authorizing Provider  amLODipine (NORVASC) 2.5 MG tablet Take 2.5 mg by mouth daily.   Yes [provider]  amoxicillin (AMOXIL) 500 MG capsule Take 500 mg by mouth 3 (three) times daily.   Yes [provider]  benazepril (LOTENSIN) 40 MG tablet Take 1 tablet (40 mg total)  by mouth daily. 09/22/16  Yes Croitoru, Mihai, MD  busPIRone (BUSPAR) 5 MG tablet Take 5 mg by mouth 3 (three) times daily. 05/24/17  Yes [provider]  furosemide (LASIX) 20 MG tablet Take 20 mg by mouth daily.   Yes [provider]  haloperidol (HALDOL) 0.5 MG tablet Take 0.5 mg by mouth 2 (two) times daily.   Yes [provider]  hydrOXYzine (ATARAX/VISTARIL) 25 MG tablet Take 25 mg by mouth at bedtime.   Yes [provider]  Melatonin 10 MG TABS Take 1 tablet by mouth daily.   Yes [provider]  Multiple Vitamin (MULTIVITAMIN WITH MINERALS) TABS tablet Take 1 tablet by mouth daily.   Yes  [provider]  pantoprazole (PROTONIX) 40 MG tablet Take 40 mg by mouth daily.   Yes [provider]  predniSONE (DELTASONE) 10 MG tablet Take 10 mg by mouth daily with breakfast.   Yes [provider]  vitamin C (ASCORBIC ACID) 500 MG tablet Take 500 mg by mouth daily.   Yes [provider]    Allergies    Patient has no known allergies.  Review of Systems   Review of Systems  Physical Exam Updated Vital Signs BP 103/84   Pulse 71   Temp 98.1 F (36.7 C) (Oral)   Resp 16   SpO2 95%   Physical Exam Vitals and nursing note reviewed.  Constitutional:      General: He is not in acute distress.    Appearance: He is well-developed and well-nourished. He is not ill-appearing.  HENT:     Head: Normocephalic and atraumatic.     Right Ear: External ear normal.     Left Ear: External ear normal.  Eyes:     Extraocular Movements: EOM normal.     Conjunctiva/sclera: Conjunctivae normal.     Pupils: Pupils are equal, round, and reactive to light.  Neck:     Trachea: Phonation normal.  Cardiovascular:     Rate and Rhythm: Normal rate and regular rhythm.  Pulmonary:     Effort: Pulmonary effort is normal. No respiratory distress.     Breath sounds: No stridor.  Chest:     Chest wall: No bony tenderness.  Abdominal:     General: There is no distension.     Palpations: Abdomen is soft.     Tenderness: There is no abdominal tenderness.  Musculoskeletal:        General: Normal range of motion.     Cervical back: Normal range of motion and neck supple.     Right lower leg: Edema present.     Left lower leg: Edema present.     Comments: Deep abrasion left mid dorsal forearm, approximately 8 cm long, and 3 cm in width.  This is irregular.  There is no active bleeding or drainage from the wound.  There is no associated deformity of the left elbow or left wrist.  Skin:    General: Skin is warm, dry and intact.  Neurological:     Mental Status: He  is alert and oriented to person, place, and time.     Cranial Nerves: No cranial nerve deficit.     Sensory: No sensory deficit.     Motor: No abnormal muscle tone.     Coordination: Coordination normal.  Psychiatric:        Mood and Affect: Mood and affect and mood normal.        Behavior: Behavior normal.  ED Results / Procedures / Treatments   Labs (all labs ordered are listed, but only abnormal results are displayed) Labs Reviewed  COMPREHENSIVE METABOLIC PANEL - Abnormal; Notable for the following components:      Result Value   Glucose, Bld 127 (*)    Total Protein 6.1 (*)    Albumin 3.1 (*)    All other components within normal limits  CBC - Abnormal; Notable for the following components:   RBC 3.83 (*)    Hemoglobin 12.4 (*)    MCV 102.9 (*)    All other components within normal limits  URINALYSIS, ROUTINE W REFLEX MICROSCOPIC  CBG MONITORING, ED    EKG None  Radiology No results found.  Procedures Procedures   Medications Ordered in ED Medications - No data to display  ED Course  I have reviewed the triage vital signs and the nursing notes.  Pertinent labs & imaging results that were available during my care of the patient were reviewed by me and considered in my medical decision making (see chart for details).    MDM Rules/Calculators/A&P                           Patient Vitals for the past 24 hrs:  BP Temp Temp src Pulse Resp SpO2  12/15/20 1645 103/84 - - 71 16 95 %  12/15/20 1611 123/85 98.1 F (36.7 C) Oral 92 16 97 %  12/15/20 1337 (!) 142/47 98.2 F (36.8 C) Oral 77 20 98 %    5:46 PM Reevaluation with update and discussion. After initial assessment and treatment, an updated evaluation reveals no change in clinical status.  I have discussed the wound of the left dorsal forearm.  This could potentially be sutured but will likely heal with symptomatic care.  Because the difficulty of restraining this patient for this type of procedure, I  feel that the risk benefit is greater for attempts at suture closure and/or sedation.  I have discussed this in depth with the daughter, who is agreeable to treat symptomatically with wound care.  All findings were discussed, and questions answered. Daleen Bo   Medical Decision Making:  This patient is presenting for evaluation of fall, Covid infection, injury left arm, which does require a range of treatment options, and is a complaint that involves a moderate risk of morbidity and mortality. The differential diagnoses include soft tissue injury, fracture, complications of Covid infection. I decided to review old records, and in summary elderly male, DNR status, being maintained at nursing home care where he fell today..  I obtained additional historical information from daughter at bedside.  Clinical Laboratory Tests Ordered, included CBC, Metabolic panel and Urinalysis. Review indicates no.  Glucose high, hemoglobin low, total protein low, albumin low.  Critical Interventions-clinical evaluation, laboratory testing, wound care, observation reassessment  After These Interventions, the Patient was reevaluated and was found stable for discharge back to his nursing care facility.  Complications of Covid infection, serious injury or metabolic disorder.  Mathews Argyle. was evaluated in Emergency Department on 12/15/2020 for the symptoms described in the history of present illness. He was evaluated in the context of the global COVID-19 pandemic, which necessitated consideration that the patient might be at risk for infection with the SARS-CoV-2 virus that causes COVID-19. Institutional protocols and algorithms that pertain to the evaluation of patients at risk for COVID-19 are in a state of rapid change based on information released by  regulatory bodies including the CDC and federal and state organizations. These policies and algorithms were followed during the patient's care in the  ED.   CRITICAL CARE-no Performed by: Daleen Bo  Nursing Notes Reviewed/ Care Coordinated Applicable Imaging Reviewed Interpretation of Laboratory Data incorporated into ED treatment  The patient appears reasonably screened and/or stabilized for discharge and I doubt any other medical condition or other [facility] requiring further screening, evaluation, or treatment in the ED at this time prior to discharge.  Plan: Home Medications-continue routine medications symptomatic care for Covid infection; Home Treatments-support, wound care; return here if the recommended treatment, does not improve the symptoms; Recommended follow up-PCP, prn     Final Clinical Impression(s) / ED Diagnoses Final diagnoses:  COVID-19  Abrasion of left upper extremity, initial encounter  Fall, initial encounter    Rx / DC Orders ED Discharge Orders    None       Daleen Bo, MD 12/15/20 1750

## 2020-12-15 NOTE — ED Notes (Signed)
Wound irrigated, bacitracin placed, and wound wrapped with gauze.

## 2020-12-15 NOTE — ED Triage Notes (Signed)
Pt to triage via [facility] from [facility].  Pt was assisted to bathroom today and staff came back 30 min later and he was lying in bed and would follow commands but wouldn't open his eyes.  They state he was asleep and wouldn't wake up.  Pt sitting up for EMS and when asked if he wanted to go get something to eat pt opened his eyes.  Diagnosed with COVID within the last week.  Pt has bandage to L forearm.  EMS reports fall 2 days ago with laceration. Bleeding controlled.

## 2020-12-15 NOTE — Progress Notes (Signed)
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 Kenneth Clay. Is our current hospice patient that resides at [facility]. Staff notified patients daughter about mental status change after being taken to bathroom. Patient wouldn't wake up for staff at facility but once in EMS he perked up. Patient transferred to [facility] ED for evaluation.  Spoke to daughter Helene Kelp. She is aware of transfer and is at bedside in ED. She is worried since patient has been on antibiotics for COVID if this could have possibly caused a UTI. Is also aware that patient was just recently started on Lasix at [facility].   Current VS stable. Lab work   Glucose: 127 (H) Albumin: 3.1 (L) Total Protein: 6.1 (L) RBC: 3.83 (L) Hemoglobin: 12.4 (L) MCV: 102.9 (H)  Unsure if pt will be admitted at this time. Please feel free to call with discharge transition or hospice related questions. [facility] will follow until D/C. Please notify [facility] if patient will be admitted.   Thank you, Clementeen Hoof, BSN, [facility] [phone]

## 2020-12-15 NOTE — ED Notes (Signed)
Attempted to call report - [phone], No answer.

## 2020-12-25 ENCOUNTER — Telehealth: Payer: Self-pay

## 2020-12-25 ENCOUNTER — Ambulatory Visit (INDEPENDENT_AMBULATORY_CARE_PROVIDER_SITE_OTHER)

## 2020-12-25 DIAGNOSIS — I442 Atrioventricular block, complete: Secondary | ICD-10-CM | POA: Diagnosis not present

## 2020-12-25 NOTE — Telephone Encounter (Signed)
I let the patient daughter know we did not receive the transmission. I let her know to make sure she press and release the button twice. I also let her know when the patient is doing the transmission he needs to be within 1 foot of the monitor while it is on the heart icon. I gave them the number to [facility] support to get additional help with the monitor.

## 2020-12-27 LAB — CUP PACEART REMOTE DEVICE CHECK
Battery Remaining Longevity: 7 mo
Battery Remaining Percentage: 6 %
Battery Voltage: 2.68 V
Brady Statistic AP VP Percent: 98 %
Brady Statistic AP VS Percent: 1 %
Brady Statistic AS VP Percent: 1 %
Brady Statistic AS VS Percent: 1 %
Brady Statistic RA Percent Paced: 98 %
Brady Statistic RV Percent Paced: 99 %
Date Time Interrogation Session: 20220209210037
Implantable Lead Implant Date: 19980305
Implantable Lead Implant Date: 19980305
Implantable Lead Location: 753859
Implantable Lead Location: 753860
Implantable Pulse Generator Implant Date: 20140218
Lead Channel Impedance Value: 400 Ohm
Lead Channel Impedance Value: 480 Ohm
Lead Channel Pacing Threshold Amplitude: 0.75 V
Lead Channel Pacing Threshold Amplitude: 1.5 V
Lead Channel Pacing Threshold Pulse Width: 0.4 ms
Lead Channel Pacing Threshold Pulse Width: 0.4 ms
Lead Channel Sensing Intrinsic Amplitude: 10.3 mV
Lead Channel Sensing Intrinsic Amplitude: 4.3 mV
Lead Channel Setting Pacing Amplitude: 1.75 V
Lead Channel Setting Pacing Amplitude: 2 V
Lead Channel Setting Pacing Pulse Width: 0.4 ms
Lead Channel Setting Sensing Sensitivity: 2 mV
Pulse Gen Model: 2210
Pulse Gen Serial Number: 7448063

## 2020-12-30 NOTE — Progress Notes (Signed)
Remote pacemaker transmission.   

## 2021-02-07 DIAGNOSIS — Z79899 Other long term (current) drug therapy: Secondary | ICD-10-CM | POA: Diagnosis not present

## 2021-02-07 DIAGNOSIS — N390 Urinary tract infection, site not specified: Secondary | ICD-10-CM | POA: Diagnosis not present

## 2021-03-18 DIAGNOSIS — Z79899 Other long term (current) drug therapy: Secondary | ICD-10-CM | POA: Diagnosis not present

## 2021-03-24 DIAGNOSIS — Z79899 Other long term (current) drug therapy: Secondary | ICD-10-CM | POA: Diagnosis not present

## 2021-03-28 ENCOUNTER — Telehealth: Payer: Self-pay

## 2021-03-28 NOTE — Telephone Encounter (Signed)
Had a long discussion with his daughter Helene Kelp.  Mr. Brar has advanced dementia and is unable to participate in his own care or make any decisions. He is under hospice care for lymphoma and is a NK resident at [facility]. He is sometimes combative with nursing staff. Unfortunately, he is pacemaker dependent and his device has reached ERI.  Pacemaker generator changeout may cause more harm and discomfort than benefit.  Helene Kelp does not think her father would cooperate with the procedure unless he is "fully under".  In my opinion, it is medically best to not perform the generator changeout and allow a natural death. The device will reliably offer three more months of backup pacing and an additional unpredictable period of less reliable service beyond that. The backup pacing rate will become gradually slower, but will not cause immediate demise. It is possible, even likely that he will pass from his other medical problems before that. Helene Kelp was understandably emotional and "is not ready to let him go", but also stated that "he is not really living" and that his quality of life is terrible. She will consult with the rest of her family and give [place] notification regarding their decision. She understands that she can call with any questions.

## 2021-03-28 NOTE — Telephone Encounter (Signed)
[facility] alert received Battery at [facility] as of 03/27/21. Spoke to patients daughter Helene Kelp, [place]) and advised device has reached ERI. States patient is currently under the care of hospice with a diagnosis of lymphoma. States she just has some questions and concerns about what all will be involved considering her father (patient) has been combative prior. Has questions specially about sedation and what may be the best option. Advised I will forward to Dr. Sallyanne Kuster and nurse to see if they may can help answer her questions.   Patient will also need to be scheduled for gen change.

## 2021-04-03 NOTE — Telephone Encounter (Signed)
Called the daughter to confirm. She stated that the three sisters discussed it and have decided to not do the battery change out for the patient's pacemaker. She has been advised to call if they need anything.

## 2021-04-03 NOTE — Telephone Encounter (Signed)
Thank you for the update!

## 2021-04-03 NOTE — Telephone Encounter (Signed)
Pt daughter called in and stated that her and her sister has decided they do not  want the pt to under go the procedure.  She stated if Dr Sallyanne Kuster need to call her back the best number to reach her was [phone]

## 2021-04-08 DIAGNOSIS — Z79899 Other long term (current) drug therapy: Secondary | ICD-10-CM | POA: Diagnosis not present

## 2021-04-21 ENCOUNTER — Emergency Department (HOSPITAL_COMMUNITY)
Admission: EM | Admit: 2021-04-21 | Discharge: 2021-04-21 | Disposition: A | Discharge status: Home / Self Care | Attending: Emergency Medicine | Admitting: Emergency Medicine

## 2021-04-21 ENCOUNTER — Emergency Department (HOSPITAL_COMMUNITY)

## 2021-04-21 ENCOUNTER — Encounter (HOSPITAL_COMMUNITY): Payer: Self-pay

## 2021-04-21 DIAGNOSIS — M160 Bilateral primary osteoarthritis of hip: Secondary | ICD-10-CM | POA: Diagnosis not present

## 2021-04-21 DIAGNOSIS — I10 Essential (primary) hypertension: Secondary | ICD-10-CM | POA: Insufficient documentation

## 2021-04-21 DIAGNOSIS — M5136 Other intervertebral disc degeneration, lumbar region: Secondary | ICD-10-CM | POA: Diagnosis not present

## 2021-04-21 DIAGNOSIS — Y92129 Unspecified place in nursing home as the place of occurrence of the external cause: Secondary | ICD-10-CM | POA: Insufficient documentation

## 2021-04-21 DIAGNOSIS — F0391 Unspecified dementia with behavioral disturbance: Secondary | ICD-10-CM

## 2021-04-21 DIAGNOSIS — S0990XA Unspecified injury of head, initial encounter: Secondary | ICD-10-CM | POA: Diagnosis not present

## 2021-04-21 DIAGNOSIS — Z87891 Personal history of nicotine dependence: Secondary | ICD-10-CM | POA: Insufficient documentation

## 2021-04-21 DIAGNOSIS — Z79899 Other long term (current) drug therapy: Secondary | ICD-10-CM | POA: Diagnosis not present

## 2021-04-21 DIAGNOSIS — W01198A Fall on same level from slipping, tripping and stumbling with subsequent striking against other object, initial encounter: Secondary | ICD-10-CM | POA: Diagnosis not present

## 2021-04-21 DIAGNOSIS — Z950 Presence of cardiac pacemaker: Secondary | ICD-10-CM | POA: Diagnosis not present

## 2021-04-21 DIAGNOSIS — G319 Degenerative disease of nervous system, unspecified: Secondary | ICD-10-CM | POA: Diagnosis not present

## 2021-04-21 DIAGNOSIS — S199XXA Unspecified injury of neck, initial encounter: Secondary | ICD-10-CM | POA: Diagnosis not present

## 2021-04-21 DIAGNOSIS — I2510 Atherosclerotic heart disease of native coronary artery without angina pectoris: Secondary | ICD-10-CM | POA: Diagnosis not present

## 2021-04-21 DIAGNOSIS — S59902A Unspecified injury of left elbow, initial encounter: Secondary | ICD-10-CM | POA: Diagnosis present

## 2021-04-21 DIAGNOSIS — Z743 Need for continuous supervision: Secondary | ICD-10-CM | POA: Diagnosis not present

## 2021-04-21 DIAGNOSIS — F0281 Dementia in other diseases classified elsewhere with behavioral disturbance: Secondary | ICD-10-CM | POA: Diagnosis not present

## 2021-04-21 DIAGNOSIS — W19XXXA Unspecified fall, initial encounter: Secondary | ICD-10-CM | POA: Diagnosis not present

## 2021-04-21 DIAGNOSIS — Z043 Encounter for examination and observation following other accident: Secondary | ICD-10-CM | POA: Diagnosis not present

## 2021-04-21 DIAGNOSIS — J439 Emphysema, unspecified: Secondary | ICD-10-CM | POA: Diagnosis not present

## 2021-04-21 DIAGNOSIS — G309 Alzheimer's disease, unspecified: Secondary | ICD-10-CM | POA: Insufficient documentation

## 2021-04-21 DIAGNOSIS — Z8572 Personal history of non-Hodgkin lymphomas: Secondary | ICD-10-CM | POA: Diagnosis not present

## 2021-04-21 DIAGNOSIS — M25522 Pain in left elbow: Secondary | ICD-10-CM | POA: Diagnosis not present

## 2021-04-21 DIAGNOSIS — S51012A Laceration without foreign body of left elbow, initial encounter: Secondary | ICD-10-CM | POA: Diagnosis not present

## 2021-04-21 DIAGNOSIS — R6889 Other general symptoms and signs: Secondary | ICD-10-CM | POA: Diagnosis not present

## 2021-04-21 NOTE — ED Notes (Signed)
Placed external condom cath on pt

## 2021-04-21 NOTE — ED Notes (Signed)
Applied mittens d/t pt pulling off leads

## 2021-04-21 NOTE — ED Triage Notes (Addendum)
Patient arrived via [facility] from [facility].   Patient fell today. No LOC. No blood thinners.   Patient hit his head on the floor. Witnessed fall.   Hospice ordered him out to the ED for evaluation.   Skin tear on the left elbow.   EMS unsure if patient is DNR.   A/O to self and his normal.    EMS has given patient cookies to allow for care. Patient is very combative and has tried to strike ems multiple times. The cookies encourage him to be compliant and non violent.   Has pace maker  HR- 50-55 strong and irregular   No hx a.fibb that ems was told.   Ems was not given a lot of information on patient.   Hx: Dementia

## 2021-04-21 NOTE — ED Notes (Signed)
Patient was able to get CT. Patient was unable to follow through with Xray due to agitation.   Patient given crackers in room to calm him down. Daughter at bedside.

## 2021-04-21 NOTE — ED Notes (Signed)
Patient transported to X-ray 

## 2021-04-21 NOTE — ED Notes (Signed)
Pt unable to sign MSE for himself at this time.   Unsure if patient is DNR.   Patient in hospice care at [facility].   Patient asleep at this time time and not combative.

## 2021-04-21 NOTE — ED Notes (Addendum)
Family member at bedside. Patient is a DNR per family.

## 2021-04-21 NOTE — ED Notes (Signed)
[facility] has been dispatched. ETA- couple hours or more.

## 2021-04-21 NOTE — Progress Notes (Signed)
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  [facility] triage was notified via telephone by [facility] facility that patient was enroute to ED for evaluation after hitting his head after a fall.    [facility] will continue to follow patient during this hospitalization.  Please call with any hospice related questions,  Gar Ponto, RN [facility] HLT [phone]

## 2021-04-21 NOTE — ED Provider Notes (Addendum)
[facility] DEPT Provider Note   CSN: [id number] Arrival date & time: 04/21/21  1102     History Chief Complaint  Patient presents with  . Fall  . Laceration    Kenneth Cerny. is a 87 y.o. male.  Patient from [facility].  Patient under hospice care.  Patient apparently had fall today no loss of conscious not on blood thinners.  They state that he hit his head on the floor it was a witnessed fall.  Hospice ordered him out to the ED for evaluation.  Also skin tears on the left elbow area.  Patient is stated to be a very heavy sleeper and is combative when he is awake.  Currently sleeping.  Past medical history significant for complete heart block with pacemaker.  Dementia.  Hypertension hyperlipidemia.  And history of lymphoma.        Past Medical History:  Diagnosis Date  . CHB (complete heart block) ([place]) 09/03/2015  . Dementia ([place])   . Depression   . H/O cardiovascular stress test 04/30/2010   Persantine myoview-normal  . H/O: GI bleed   . Hyperlipidemia   . Hypertension   . LV dysfunction    last echo-04/30/10, EF 50-55%, impaired LV relaxation  . Pacemaker    new gen. last check with insert 01/03/13  . Sinus node dysfunction ([facility])   . Ulcer     Patient Active Problem List   Diagnosis Date Noted  . Hypercholesterolemia 10/20/2018  . CAP (community acquired pneumonia) 01/12/2016  . NSTEMI (non-ST elevated myocardial infarction) ([place]) 01/12/2016  . DOE (dyspnea on exertion) 01/12/2016  . Elevated troponin   . Sepsis ([place])   . Sinus node dysfunction ([place]) 09/03/2015  . 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 CHB (complete heart block) ([place]) 09/03/2015  . HTN (hypertension) 01/03/2013  . Dyslipidemia 01/03/2013  . Abnormal involuntary movements(781.0) 12/27/2012  . Alzheimer's disease ([place]) 12/27/2012  . Unspecified visual loss 12/27/2012  . Other  postprocedural status(V45.89) 12/27/2012  . Pacemaker for sinus node dysfunction-'98, '05- Gen change 01/03/13 (St Jude)   . Dementia ([place])   . Prior PUD     Past Surgical History:  Procedure Laterality Date  . PACEMAKER GENERATOR CHANGE  2005   [employer] Jude Idenity ADxXL DR  . PACEMAKER GENERATOR CHANGE  01/03/2013   ST[employer] Jude Accent DR RF  . PACEMAKER GENERATOR CHANGE N/A 01/03/2013   Procedure: PACEMAKER GENERATOR CHANGE;  Surgeon: Sanda Klein, MD;  Location: [facility] CATH LAB;  Service: Cardiovascular;  Laterality: N/A;  . PACEMAKER INSERTION  1998  . ROTATOR CUFF REPAIR     bilateral  . sciatica     injection 06-2015  . US ECHOCARDIOGRAPHY  04/30/10   aortic root sclerotic,RV mildly dilated       Family History  Problem Relation Age of Onset  . Diabetes Mother   . Heart disease Father   . Diabetes Brother     Social History   Tobacco Use  . Smoking status: Former Smoker    Quit date: 11/16/2004    Years since quitting: 16.4  . Smokeless tobacco: Never Used  Substance Use Topics  . Alcohol use: No  . Drug use: No    Home Medications Prior to Admission medications   Medication Sig Start Date End Date Taking? Authorizing Provider  amLODipine (NORVASC) 2.5 MG tablet Take 2.5 mg by mouth daily.    [provider]  amoxicillin (  AMOXIL) 500 MG capsule Take 500 mg by mouth 3 (three) times daily.    [provider]  benazepril (LOTENSIN) 40 MG tablet Take 1 tablet (40 mg total) by mouth daily. 09/22/16   Croitoru, Mihai, MD  busPIRone (BUSPAR) 5 MG tablet Take 5 mg by mouth 3 (three) times daily. 05/24/17   [provider]  furosemide (LASIX) 20 MG tablet Take 20 mg by mouth daily.    [provider]  haloperidol (HALDOL) 0.5 MG tablet Take 0.5 mg by mouth 2 (two) times daily.    [provider]  hydrOXYzine (ATARAX/VISTARIL) 25 MG tablet Take 25 mg by mouth at bedtime.    [provider]  Melatonin 10 MG TABS Take 1 tablet by  mouth daily.    [provider]  Multiple Vitamin (MULTIVITAMIN WITH MINERALS) TABS tablet Take 1 tablet by mouth daily.    [provider]  pantoprazole (PROTONIX) 40 MG tablet Take 40 mg by mouth daily.    [provider]  predniSONE (DELTASONE) 10 MG tablet Take 10 mg by mouth daily with breakfast.    [provider]  vitamin C (ASCORBIC ACID) 500 MG tablet Take 500 mg by mouth daily.    [provider]    Allergies    Patient has no known allergies.  Review of Systems   Review of Systems  Unable to perform ROS: Dementia    Physical Exam Updated Vital Signs BP (!) 115/47 (BP Location: Right Arm)   Pulse 62   Temp 98.4 F (36.9 C) (Oral)   Resp 15   SpO2 90%   Physical Exam Vitals and nursing note reviewed.  Constitutional:      General: He is not in acute distress.    Appearance: He is well-developed. He is not ill-appearing.     Comments: Patient sleeping.  HENT:     Head: Normocephalic and atraumatic.  Cardiovascular:     Rate and Rhythm: Normal rate and regular rhythm.     Heart sounds: No murmur heard.   Pulmonary:     Effort: Pulmonary effort is normal. No respiratory distress.     Breath sounds: Normal breath sounds.  Abdominal:     Palpations: Abdomen is soft.     Tenderness: There is no abdominal tenderness.  Musculoskeletal:     Cervical back: Normal range of motion and neck supple.     Comments: Left elbow with an anterior and posterior superficial skin tears.  Some swelling.  Radial pulse distally to the left upper extremity 2+.  Patient has upper extremities restrained with mitts.  Skin:    General: Skin is warm and dry.  Neurological:     Mental Status: Mental status is at baseline.     Comments: Patient sleeping.  The apparently is a heavy sleeper.  This is not unusual.     ED Results / Procedures / Treatments   Labs (all labs ordered are listed, but only abnormal results are displayed) Labs  Reviewed - No data to display  EKG None  Radiology No results found.  Procedures Procedures   Medications Ordered in ED Medications - No data to display  ED Course  I have reviewed the triage vital signs and the nursing notes.  Pertinent labs & imaging results that were available during my care of the patient were reviewed by me and considered in my medical decision making (see chart for details).    MDM Rules/Calculators/A&P  Will get CT head neck due to the witnessed fall stating he hit his head.  No evidence of any head trauma.  Superficial skin tears to left elbow anterior and posteriorly.  No active bleeding.  We will go ahead and get x-rays of the left elbow.  And then just also x-ray both hips and pelvis.  CT head neck without any acute findings.  Does have a lot of adenopathy consistent with his diagnosis of lymphoma.  X-rays of the left elbow both hips and pelvis without acute bony abnormality.  Stable for discharge back to nursing facility.  Patient is under hospice care.  Family is aware of the extensive lymphoma.   Final Clinical Impression(s) / ED Diagnoses Final diagnoses:  Fall, initial encounter  Dementia with behavioral disturbance, unspecified dementia type ([place])  Skin tear of left elbow without complication, initial encounter    Rx / DC Orders ED Discharge Orders    None       Fredia Sorrow, MD 04/21/21 1140    Fredia Sorrow, MD 04/21/21 1425

## 2021-04-21 NOTE — Discharge Instructions (Signed)
CT head and neck without any acute findings.  X-ray of left elbow both hips and pelvis without any bony abnormalities.  Given tears to the left elbow area can be treated with local wound care.  Antibiotic ointment and nonadherent dressing.  Patient stable for return to nursing facility.

## 2021-04-21 NOTE — ED Notes (Signed)
Fall bundle Yellow Wrist band on Bed alarm on Fall risk sign on pt's door

## 2021-04-25 ENCOUNTER — Ambulatory Visit (INDEPENDENT_AMBULATORY_CARE_PROVIDER_SITE_OTHER)

## 2021-04-25 ENCOUNTER — Telehealth: Payer: Self-pay

## 2021-04-25 DIAGNOSIS — I442 Atrioventricular block, complete: Secondary | ICD-10-CM

## 2021-04-25 LAB — CUP PACEART REMOTE DEVICE CHECK
Battery Remaining Longevity: 0 mo
Battery Voltage: 2.57 V
Brady Statistic AP VP Percent: 97 %
Brady Statistic AP VS Percent: 1.1 %
Brady Statistic AS VP Percent: 1.1 %
Brady Statistic AS VS Percent: 1 %
Brady Statistic RA Percent Paced: 97 %
Brady Statistic RV Percent Paced: 98 %
Date Time Interrogation Session: 20220610142011
Implantable Lead Implant Date: 19980305
Implantable Lead Implant Date: 19980305
Implantable Lead Location: 753859
Implantable Lead Location: 753860
Implantable Pulse Generator Implant Date: 20140218
Lead Channel Impedance Value: 400 Ohm
Lead Channel Impedance Value: 510 Ohm
Lead Channel Pacing Threshold Amplitude: 0.75 V
Lead Channel Pacing Threshold Amplitude: 1.125 V
Lead Channel Pacing Threshold Pulse Width: 0.4 ms
Lead Channel Pacing Threshold Pulse Width: 0.4 ms
Lead Channel Sensing Intrinsic Amplitude: 10 mV
Lead Channel Sensing Intrinsic Amplitude: 3.7 mV
Lead Channel Setting Pacing Amplitude: 1.375
Lead Channel Setting Pacing Amplitude: 2 V
Lead Channel Setting Pacing Pulse Width: 0.4 ms
Lead Channel Setting Sensing Sensitivity: 2 mV
Pulse Gen Model: 2210
Pulse Gen Serial Number: 7448063

## 2021-04-25 NOTE — Telephone Encounter (Signed)
The patient hospice nurse called to help him send a manual transmission. When I asked for the patient DOB she rudely stated that we called them this morning and should have his information. I let her know we need the dob to identify the correct patient. I called tech support to get additional help with the monitor.

## 2021-04-28 ENCOUNTER — Encounter

## 2021-05-16 NOTE — Progress Notes (Signed)
Remote pacemaker transmission.   

## 2021-05-21 ENCOUNTER — Telehealth: Payer: Self-pay

## 2021-05-21 DIAGNOSIS — Z950 Presence of cardiac pacemaker: Secondary | ICD-10-CM

## 2021-05-21 NOTE — Telephone Encounter (Signed)
Incoming telephone call from Ancil Boozer, daughter of patient Kenneth Clay. Dereck Leep to enquire about patients remaining battery life of his device. Daughter states patient is under hospice care. Ms. Fernande Boyden states family has decided with the guidance of Dr. Sallyanne Kuster not to replace generator. She is worried that patient's device battery will deplete and patient will pass away while she is on vacation. Explained to daughter that patient now has 0 months remaining on his battery. Unfortunately there is no way to anticipate complete failure of device. Condolences provided. Ms. Fernande Boyden would like note to be routed to Dr. Sallyanne Kuster. Remote monitor will be discontinued secondary to batter life at 0.

## 2021-05-21 NOTE — Telephone Encounter (Signed)
erroneous

## 2021-05-26 ENCOUNTER — Encounter

## 2021-06-26 ENCOUNTER — Encounter

## 2021-07-17 DEATH — deceased

## 2021-07-28 ENCOUNTER — Encounter

## 2021-07-31 IMAGING — CR DG ELBOW COMPLETE 3+V*L*
4 series · 4 of 4 positions shown · non-contrast
Comparison: None.

CLINICAL DATA: Left elbow pain after a fall this morning.

EXAM:
LEFT ELBOW - COMPLETE 3+ VIEW

[x elbow ap left]
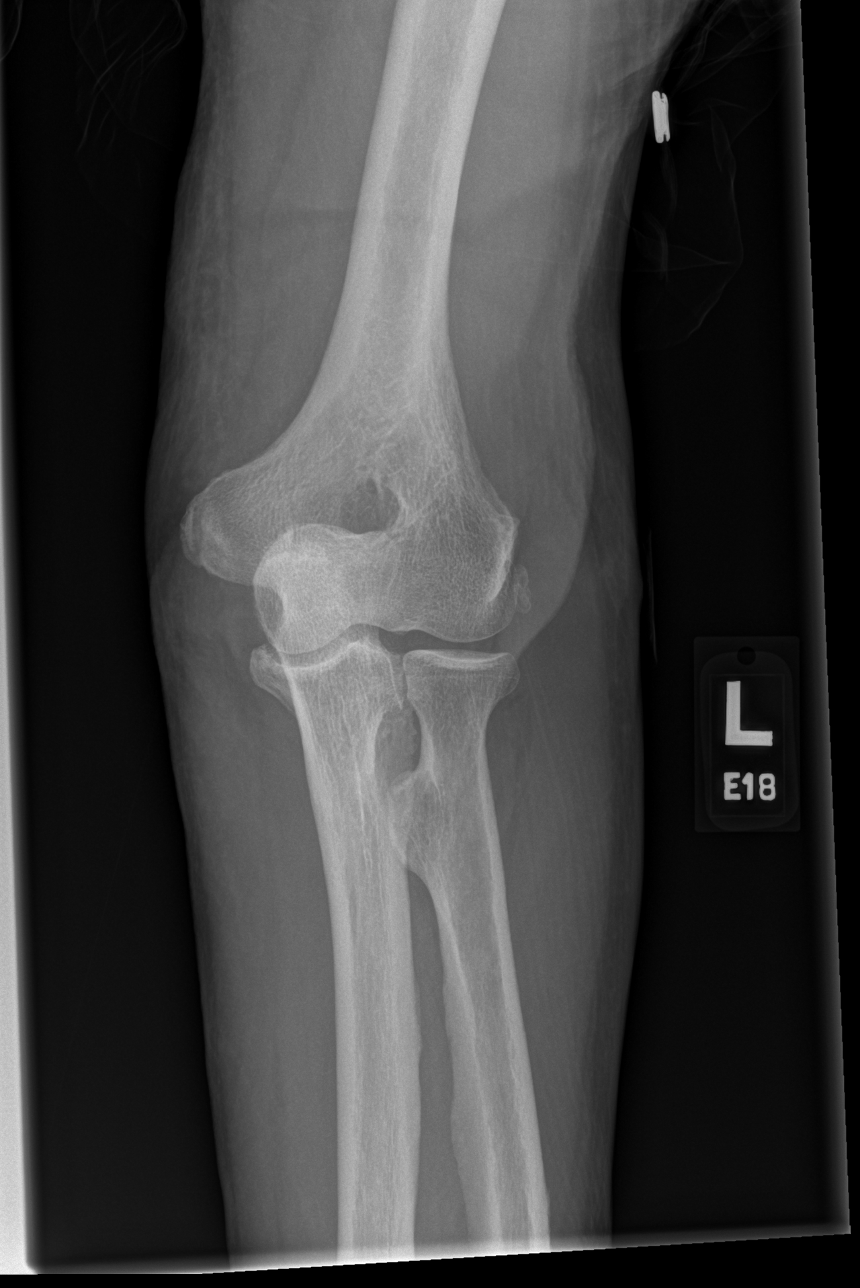

[x elbow obl left (1 of 2)]
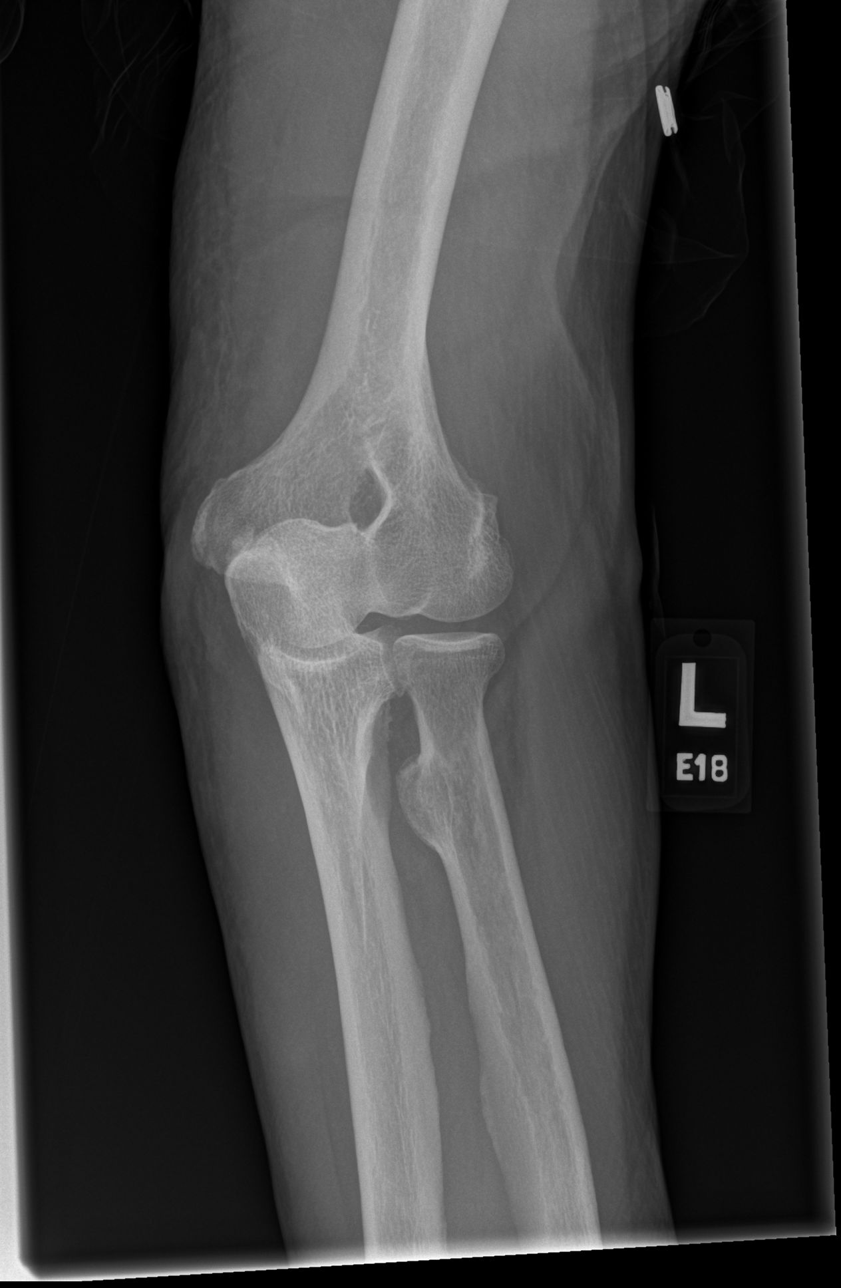

[x elbow obl left (2 of 2)]
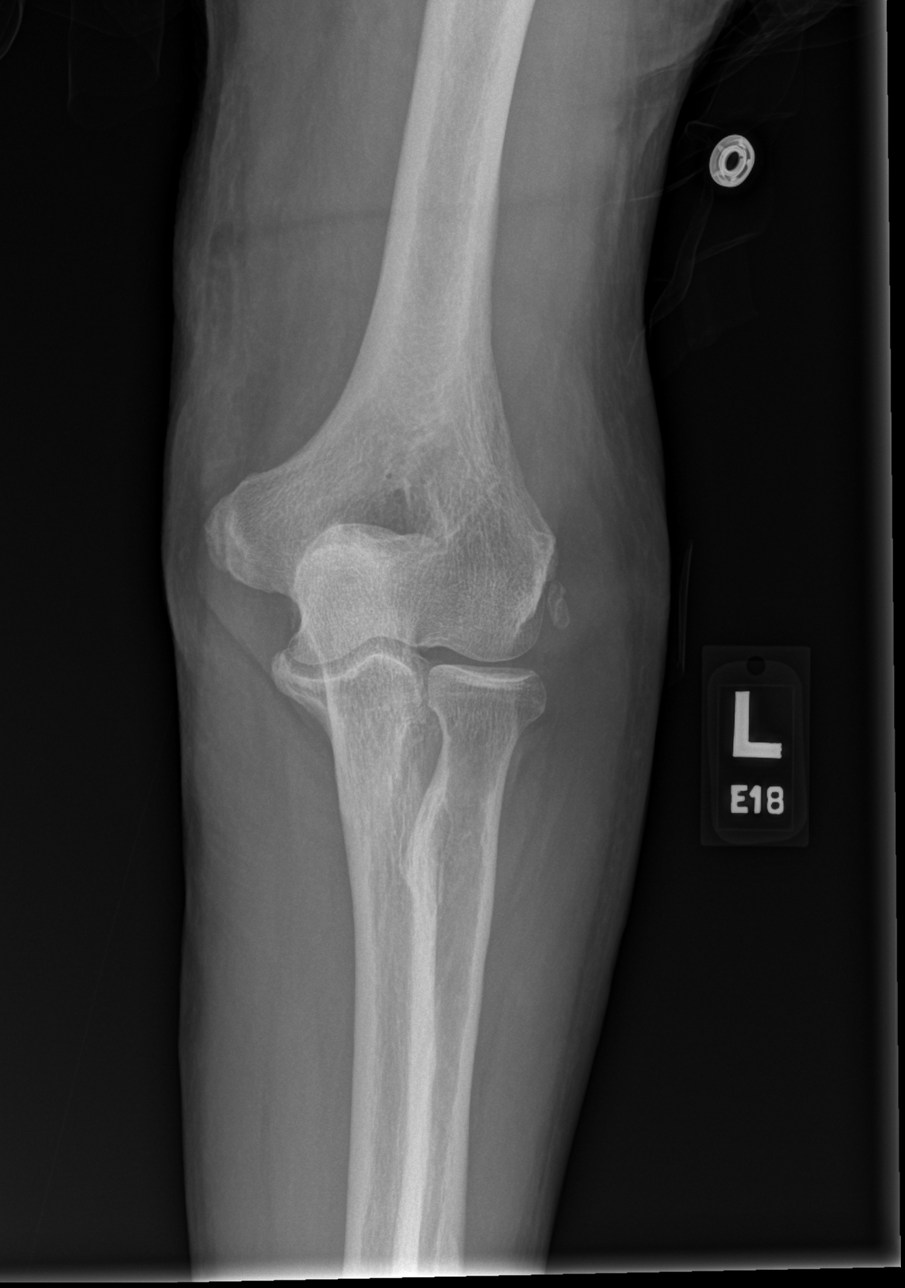

[x elbow lat left]
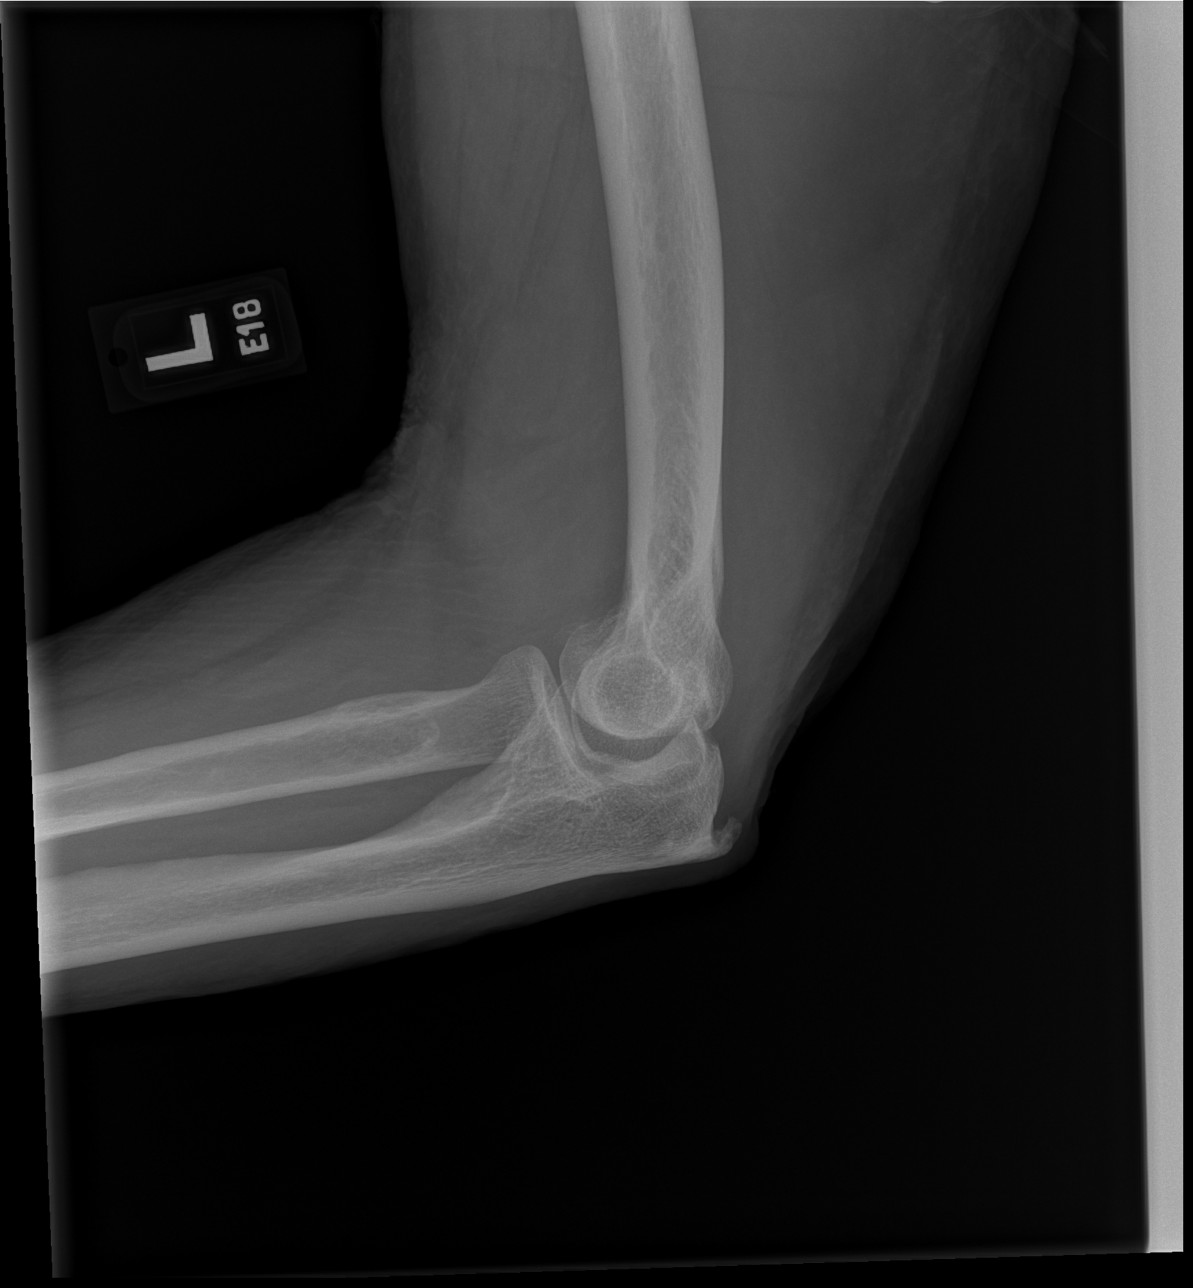

[4 of 4 positions shown; findings below may reference images not displayed]

FINDINGS: There is no acute bony or joint abnormality. The patient has some
degenerative disease about the elbow with osteophytosis best seen
along the medial aspect of the ulna. 1 cm in diameter well
corticated ossific fragment adjacent to the lateral epicondyle of
the humerus could be due to old epicondylitis or degenerative in
nature. No joint effusion.
IMPRESSION: No acute abnormality

## 2021-07-31 IMAGING — CT CT CERVICAL SPINE W/O CM
3 of 4 series · 9 of 33 positions shown, 10 images · non-contrast
Comparison: CT head and cervical spine dated November 17, 2020.

CLINICAL DATA: Fall.  Hit head on floor.  History of lymphoma.

EXAM:
CT HEAD WITHOUT CONTRAST
CT CERVICAL SPINE WITHOUT CONTRAST
TECHNIQUE: Multidetector CT imaging of the head and cervical spine was
performed following the standard protocol without intravenous
contrast. Multiplanar CT image reconstructions of the cervical spine
were also generated.

[Series 6: orthogonal bone · axial · 0.19mm/px · z∈[+1184,+1184]mm · 1 of 116 slices shown, 2 images]
[im 66/116  soft-tissue]
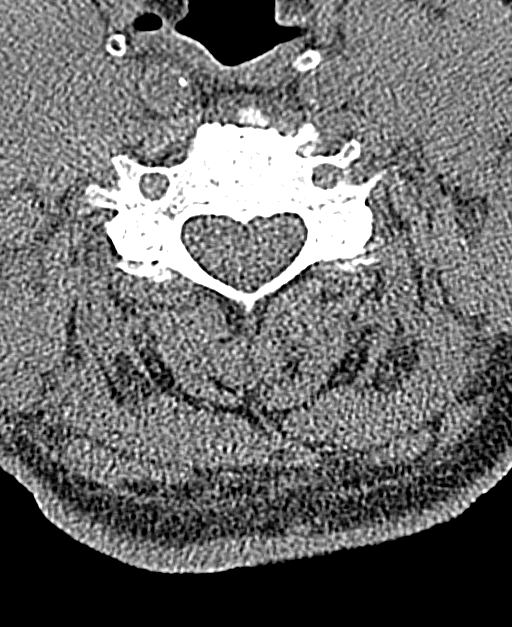
[im 66/116  bone]
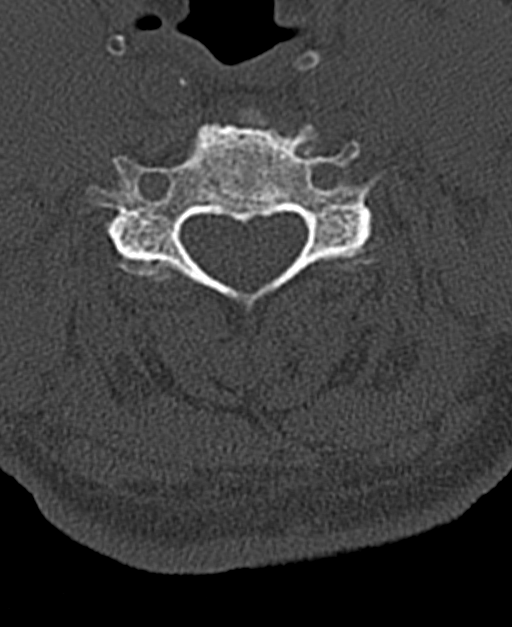

[Series 7: coronal bone · coronal · 0.19mm/px · 3 of 61 slices shown]
[im 13/61  bone]
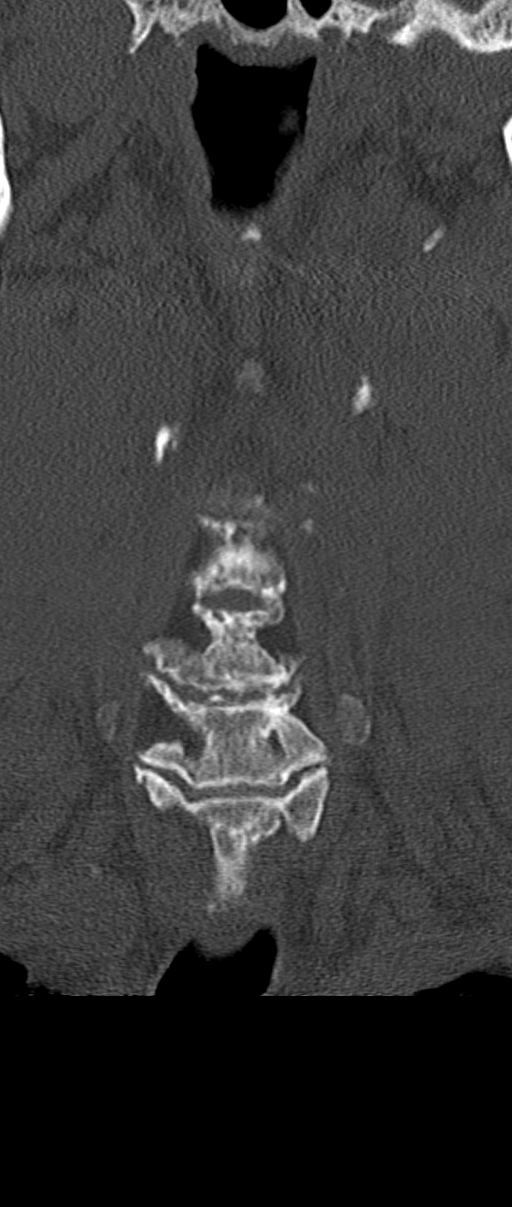
[im 25/61  bone]
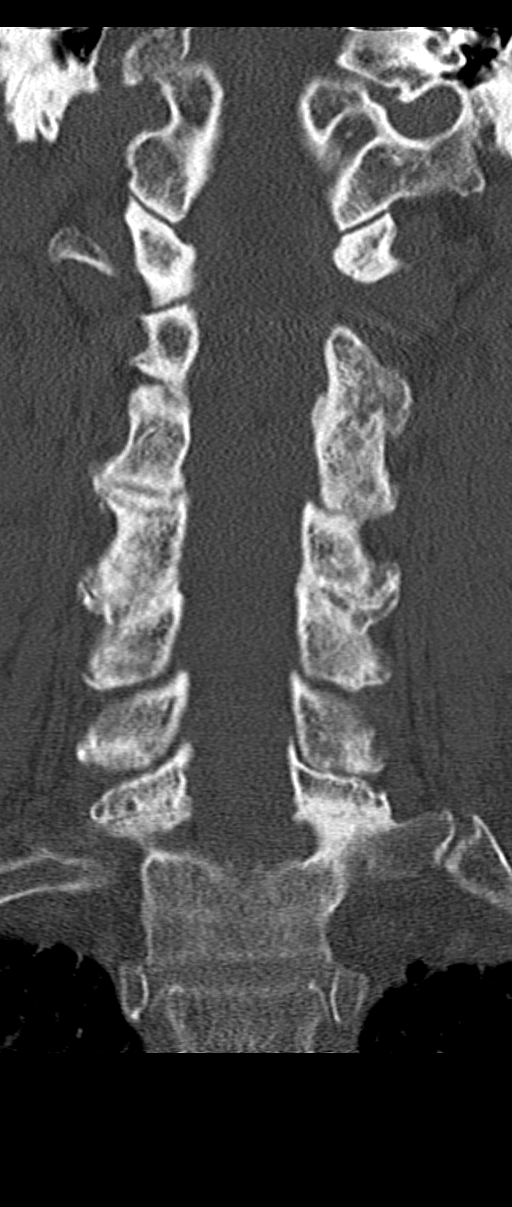
[im 37/61  bone]
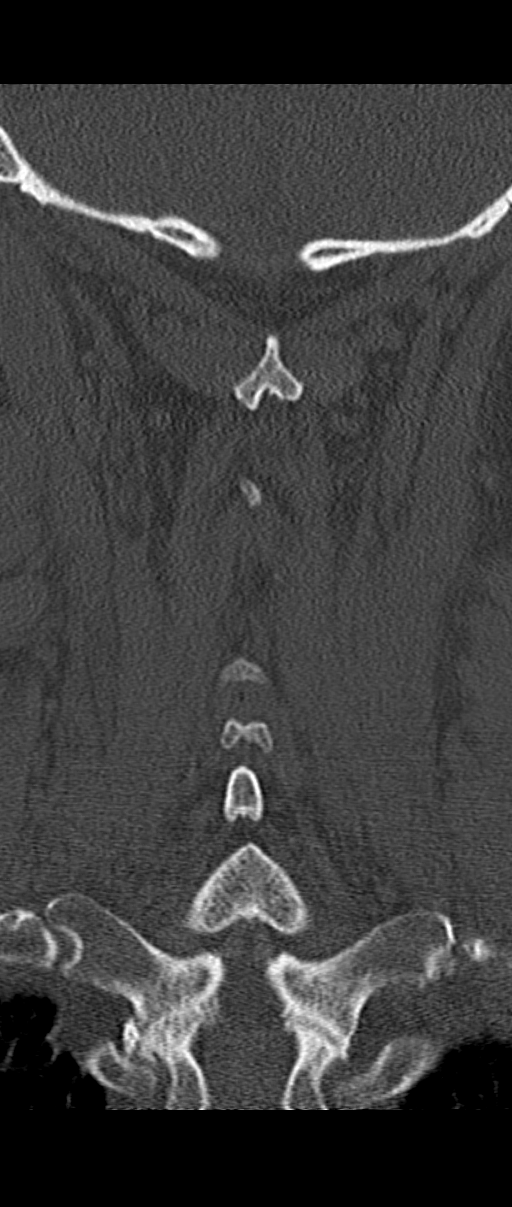

[Series 8: sagittal bone · sagittal · 0.23mm/px · 5 of 50 slices shown]
[im 17/50  bone]
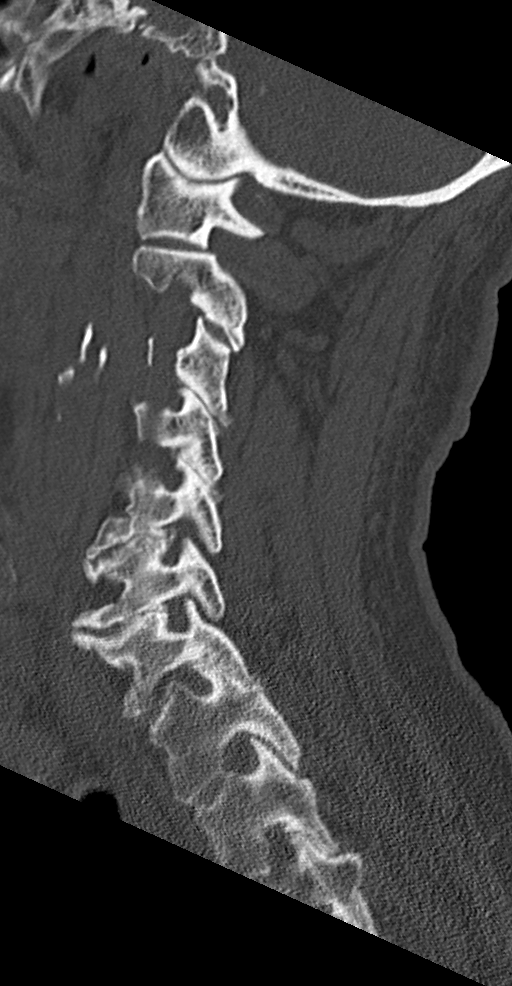
[im 21/50  bone]
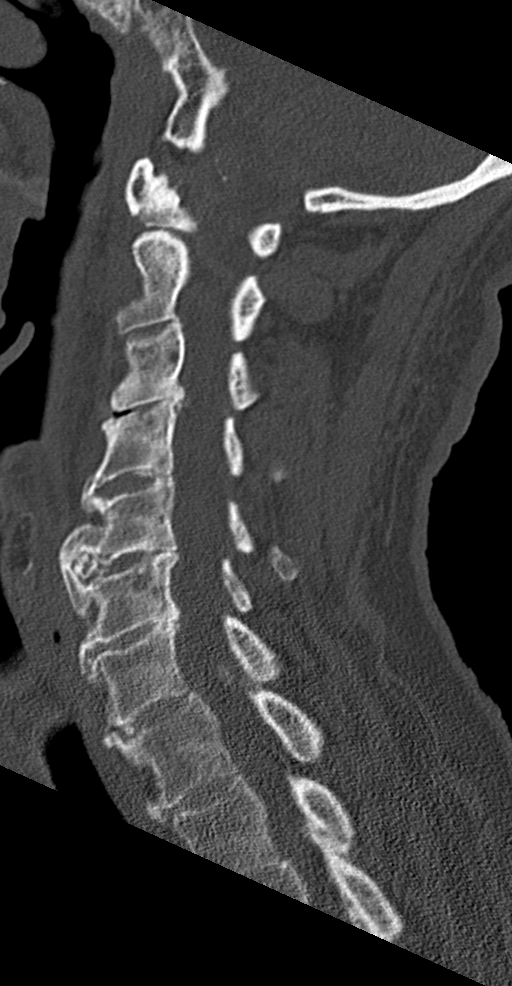
[im 25/50  bone]
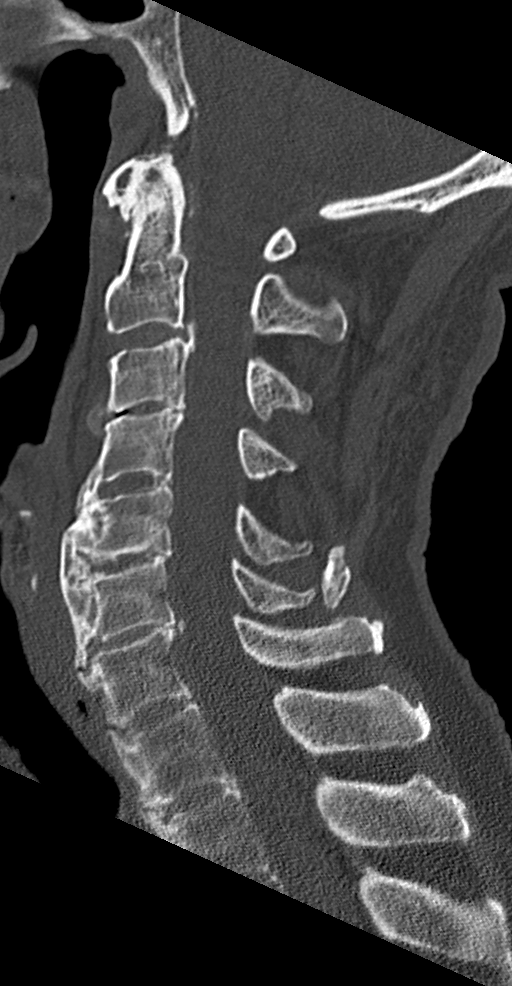
[im 29/50  bone]
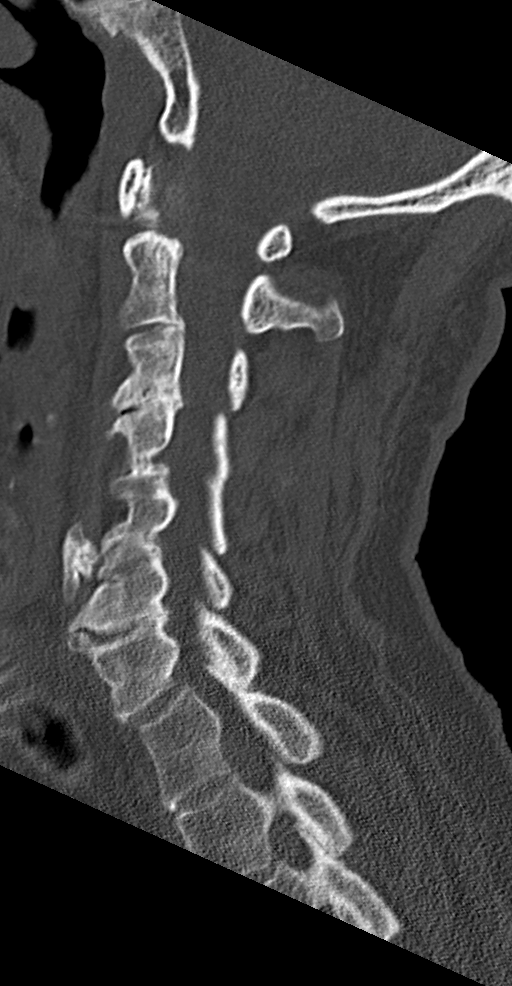
[im 33/50  bone]
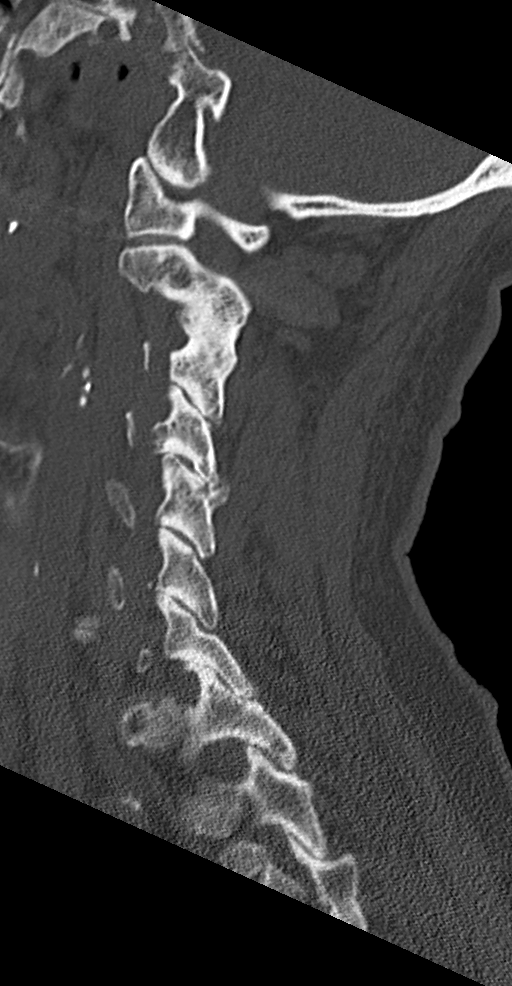

[9 of 33 positions shown; findings below may reference images not displayed]

FINDINGS: CT HEAD FINDINGS

Brain: No evidence of acute infarction, hemorrhage, hydrocephalus,
extra-axial collection or mass lesion/mass effect. Stable atrophy
and chronic microvascular ischemic changes.

Vascular: Calcified atherosclerosis at the skullbase. No hyperdense
vessel.

Skull: Normal. Negative for fracture or focal lesion.

Sinuses/Orbits: No acute finding.

Other: None.

CT CERVICAL SPINE FINDINGS

Alignment: Normal.

Skull base and vertebrae: No acute fracture. No primary bone lesion
or focal pathologic process.

Soft tissues and spinal canal: No prevertebral fluid or swelling. No
visible canal hematoma.

Disc levels: Multilevel disc height loss and facet uncovertebral
hypertrophy, progressed at C3-C4 since the prior study.

Upper chest: Emphysema.

Other: Extensive bilateral cervical and supraclavicular
lymphadenopathy again noted.
IMPRESSION: 1. No acute intracranial abnormality. Stable atrophy and chronic
microvascular ischemic changes.
2. No acute cervical spine fracture or traumatic listhesis.
3. Extensive bilateral cervical and supraclavicular lymphadenopathy
again noted, consistent with history of lymphoma.
4. Emphysema ([id number]).

## 2021-08-28 ENCOUNTER — Encounter

## 2021-09-29 ENCOUNTER — Encounter

## 2021-10-30 ENCOUNTER — Encounter

## 2021-12-01 ENCOUNTER — Encounter

## 2022-03-05 ENCOUNTER — Encounter

## 2023-01-11 ENCOUNTER — Encounter
